# Patient Record
Sex: Female | Born: 1980 | Race: Black or African American | Hispanic: No | Marital: Single | Smoking: Never smoker
Health system: Southern US, Community
[De-identification: ages and names within clinical notes are randomized; demographics above are authoritative.]

## PROBLEM LIST (undated history)

## (undated) DIAGNOSIS — N921 Excessive and frequent menstruation with irregular cycle: Secondary | ICD-10-CM

## (undated) DIAGNOSIS — M1990 Unspecified osteoarthritis, unspecified site: Secondary | ICD-10-CM

## (undated) DIAGNOSIS — D500 Iron deficiency anemia secondary to blood loss (chronic): Secondary | ICD-10-CM

## (undated) DIAGNOSIS — K602 Anal fissure, unspecified: Secondary | ICD-10-CM

## (undated) DIAGNOSIS — F329 Major depressive disorder, single episode, unspecified: Secondary | ICD-10-CM

## (undated) DIAGNOSIS — D259 Leiomyoma of uterus, unspecified: Secondary | ICD-10-CM

## (undated) DIAGNOSIS — D649 Anemia, unspecified: Secondary | ICD-10-CM

## (undated) DIAGNOSIS — M797 Fibromyalgia: Secondary | ICD-10-CM

## (undated) DIAGNOSIS — D219 Benign neoplasm of connective and other soft tissue, unspecified: Secondary | ICD-10-CM

## (undated) DIAGNOSIS — K219 Gastro-esophageal reflux disease without esophagitis: Secondary | ICD-10-CM

## (undated) DIAGNOSIS — K589 Irritable bowel syndrome without diarrhea: Secondary | ICD-10-CM

## (undated) DIAGNOSIS — F32A Depression, unspecified: Secondary | ICD-10-CM

## (undated) DIAGNOSIS — K859 Acute pancreatitis without necrosis or infection, unspecified: Secondary | ICD-10-CM

## (undated) DIAGNOSIS — E669 Obesity, unspecified: Secondary | ICD-10-CM

## (undated) DIAGNOSIS — F419 Anxiety disorder, unspecified: Secondary | ICD-10-CM

## (undated) HISTORY — DX: Anxiety disorder, unspecified: F41.9

## (undated) HISTORY — DX: Anal fissure, unspecified: K60.2

## (undated) HISTORY — DX: Leiomyoma of uterus, unspecified: D25.9

## (undated) HISTORY — DX: Anemia, unspecified: D64.9

## (undated) HISTORY — DX: Irritable bowel syndrome, unspecified: K58.9

## (undated) HISTORY — DX: Excessive and frequent menstruation with irregular cycle: N92.1

## (undated) HISTORY — DX: Benign neoplasm of connective and other soft tissue, unspecified: D21.9

## (undated) HISTORY — DX: Acute pancreatitis without necrosis or infection, unspecified: K85.90

## (undated) HISTORY — DX: Iron deficiency anemia secondary to blood loss (chronic): D50.0

## (undated) HISTORY — DX: Obesity, unspecified: E66.9

## (undated) HISTORY — DX: Gastro-esophageal reflux disease without esophagitis: K21.9

## (undated) HISTORY — DX: Unspecified osteoarthritis, unspecified site: M19.90

## (undated) HISTORY — DX: Fibromyalgia: M79.7

## (undated) HISTORY — DX: Depression, unspecified: F32.A

## (undated) HISTORY — DX: Major depressive disorder, single episode, unspecified: F32.9

## (undated) HISTORY — PX: LIPOSUCTION: SHX10

---

## 2002-05-07 ENCOUNTER — Other Ambulatory Visit: Admission: RE | Admit: 2002-05-07 | Discharge: 2002-05-07 | Payer: Self-pay | Admitting: Obstetrics & Gynecology

## 2002-05-07 ENCOUNTER — Other Ambulatory Visit: Admission: RE | Admit: 2002-05-07 | Discharge: 2002-05-07 | Payer: Self-pay | Admitting: *Deleted

## 2003-05-21 ENCOUNTER — Other Ambulatory Visit: Admission: RE | Admit: 2003-05-21 | Discharge: 2003-05-21 | Payer: Self-pay | Admitting: Internal Medicine

## 2005-02-18 ENCOUNTER — Other Ambulatory Visit: Admission: RE | Admit: 2005-02-18 | Discharge: 2005-02-18 | Payer: Self-pay | Admitting: Obstetrics and Gynecology

## 2005-05-14 ENCOUNTER — Ambulatory Visit (HOSPITAL_COMMUNITY): Admission: RE | Admit: 2005-05-14 | Discharge: 2005-05-14 | Payer: Self-pay

## 2006-04-05 ENCOUNTER — Emergency Department (HOSPITAL_COMMUNITY): Admission: EM | Admit: 2006-04-05 | Discharge: 2006-04-05 | Payer: Self-pay | Admitting: Emergency Medicine

## 2013-07-31 ENCOUNTER — Telehealth: Payer: Self-pay | Admitting: Hematology & Oncology

## 2013-07-31 NOTE — Telephone Encounter (Signed)
Spoke w NEW PATIENT today to remind them of their appointment with Dr. Ennever. Also, advised them to bring all medication bottles and insurance card information. ° °

## 2013-08-01 ENCOUNTER — Ambulatory Visit (HOSPITAL_BASED_OUTPATIENT_CLINIC_OR_DEPARTMENT_OTHER): Payer: Managed Care, Other (non HMO) | Admitting: Hematology & Oncology

## 2013-08-01 ENCOUNTER — Encounter: Payer: Self-pay | Admitting: Hematology & Oncology

## 2013-08-01 ENCOUNTER — Ambulatory Visit (HOSPITAL_BASED_OUTPATIENT_CLINIC_OR_DEPARTMENT_OTHER)
Admission: RE | Admit: 2013-08-01 | Discharge: 2013-08-01 | Disposition: A | Discharge status: Home / Self Care | Payer: Managed Care, Other (non HMO) | Source: Ambulatory Visit | Attending: Hematology & Oncology | Admitting: Hematology & Oncology

## 2013-08-01 ENCOUNTER — Other Ambulatory Visit: Payer: Managed Care, Other (non HMO) | Admitting: Lab

## 2013-08-01 ENCOUNTER — Ambulatory Visit: Payer: Managed Care, Other (non HMO)

## 2013-08-01 VITALS — BP 110/72 | HR 103 | Temp 98.8°F | Resp 14 | Ht 61.0 in | Wt 169.0 lb

## 2013-08-01 DIAGNOSIS — R591 Generalized enlarged lymph nodes: Secondary | ICD-10-CM

## 2013-08-01 DIAGNOSIS — R599 Enlarged lymph nodes, unspecified: Secondary | ICD-10-CM

## 2013-08-01 LAB — CBC WITH DIFFERENTIAL (CANCER CENTER ONLY)
BASO#: 0 10*3/uL (ref 0.0–0.2)
BASO%: 0.2 % (ref 0.0–2.0)
EOS%: 2.1 % (ref 0.0–7.0)
Eosinophils Absolute: 0.1 10*3/uL (ref 0.0–0.5)
HCT: 36.1 % (ref 34.8–46.6)
HEMOGLOBIN: 12.7 g/dL (ref 11.6–15.9)
LYMPH#: 2.6 10*3/uL (ref 0.9–3.3)
LYMPH%: 44.5 % (ref 14.0–48.0)
MCH: 31.4 pg (ref 26.0–34.0)
MCHC: 35.2 g/dL (ref 32.0–36.0)
MCV: 89 fL (ref 81–101)
MONO#: 0.8 10*3/uL (ref 0.1–0.9)
MONO%: 13.4 % — AB (ref 0.0–13.0)
NEUT#: 2.3 10*3/uL (ref 1.5–6.5)
NEUT%: 39.8 % (ref 39.6–80.0)
Platelets: 316 10*3/uL (ref 145–400)
RBC: 4.04 10*6/uL (ref 3.70–5.32)
RDW: 12.1 % (ref 11.1–15.7)
WBC: 5.8 10*3/uL (ref 3.9–10.0)

## 2013-08-01 LAB — CMP (CANCER CENTER ONLY)
ALT(SGPT): 22 U/L (ref 10–47)
AST: 25 U/L (ref 11–38)
Albumin: 3.6 g/dL (ref 3.3–5.5)
Alkaline Phosphatase: 81 U/L (ref 26–84)
BUN, Bld: 10 mg/dL (ref 7–22)
CO2: 25 mEq/L (ref 18–33)
CREATININE: 0.6 mg/dL (ref 0.6–1.2)
Calcium: 9.2 mg/dL (ref 8.0–10.3)
Chloride: 103 mEq/L (ref 98–108)
GLUCOSE: 102 mg/dL (ref 73–118)
POTASSIUM: 3.7 meq/L (ref 3.3–4.7)
Sodium: 138 mEq/L (ref 128–145)
Total Bilirubin: 0.4 mg/dl (ref 0.20–1.60)
Total Protein: 8.7 g/dL — ABNORMAL HIGH (ref 6.4–8.1)

## 2013-08-01 LAB — CHCC SATELLITE - SMEAR

## 2013-08-01 NOTE — Progress Notes (Signed)
Referral MD  Reason for Referral: Lymphadenopathy   Chief Complaint  Patient presents with  . NEW PATIENT  : I have enlarged lymph nodes in my abdomen   HPI: Ms. Liebig is a very nice 33 year old African American female. She actually lives in [place]. She comes up every day as her mother is having health issues. Her mother sounds like will be undergoing dialysis.  Ms. Pasley began to have some abdominal pain recently. This was back in early May.  She ultimately had a CT scan done. This was done at [facility]. The CT scan showed some enlarged lymph nodes in the abdomen. No lymph node was greater then 39mm. She also has some back pain at the time. She does have some uterine fibroids.  She was then seen by her gynecologist, Dr. Helane Rima. Dr. Helane Rima felt that a hematology evaluation was indicated. As such, Ms. Nardelli was, referred to the [facility].  Ms. Inga has had no fever. She's had no sweats. There's been no rashes. She's had no joint issues. There's been no cough or shortness of breath. She's had no change in bowel or bladder habits. She's never had any kind gallbladder issues. She does not smoke or drink.  She has had 3 elective abortions. I think the last one was in January of this year.  She has not noted any swollen glands.  She patient lost about 20 pounds.   No past medical history on file.:  No past surgical history on file.:  Current outpatient prescriptions:amphetamine-dextroamphetamine (ADDERALL) 20 MG tablet, Take 20 mg by mouth 2 (two) times daily., Disp: , Rfl: ;  B Complex Vitamins (VITAMIN B COMPLEX PO), Take by mouth every morning., Disp: , Rfl: ;  fluticasone (FLONASE) 50 MCG/ACT nasal spray, Place 1 spray into both nostrils as needed. , Disp: , Rfl: ;  omeprazole (PRILOSEC) 20 MG capsule, Take 20 mg by mouth as needed. , Disp: , Rfl:  Zinc-Magnesium Aspart-Vit B6 (ZINC MAGNESIUM ASPARTATE PO), Take by mouth every morning., Disp: , Rfl: ;   PRISTIQ 100 MG 24 hr tablet, , Disp: , Rfl: :  :  Allergies  Allergen Reactions  . Penicillins     rash  :  No family history on file.:  History   Social History  . Marital Status: Single    Spouse Name: N/A    Number of Children: N/A  . Years of Education: N/A   Occupational History  . Not on file.   Social History Main Topics  . Smoking status: Never Smoker   . Smokeless tobacco: Never Used     Comment: never used tobacco  . Alcohol Use: Not on file  . Drug Use: Not on file  . Sexual Activity: Not on file   Other Topics Concern  . Not on file   Social History Narrative  . No narrative on file  :  Pertinent items are noted in HPI.  Exam: @IPVITALS @  this is a well-developed and well-nourished [place] American female in no obvious distress. Vital signs show temperature of 98.8. Pulse 103. Blood pressure 110/72. Weight is 169 pounds. Head and neck exam shows no ocular or oral lesions. She has no palpable cervical or supraclavicular nodes. Lungs are clear. Cardiac exam regular rate and rhythm with no murmurs rubs or bruits. Axillary exam shows no bilateral axillary adenopathy. Abdominal exam is soft. Has good bowel sounds. There is no fluid wave. There is no guarding or rebound tenderness. She has no palpable liver  or spleen tip. Back exam no tenderness over the spine ribs or hips. Extremities shows no clubbing cyanosis or edema. Neurological exam shows no focal neurological deficits. Skin exam shows no rashes ecchymoses or petechia.    Recent Labs  08/01/13 1159  WBC 5.8  HGB 12.7  HCT 36.1  PLT 316    Recent Labs  08/01/13 1159  NA 138  K 3.7  CL 103  CO2 25  GLUCOSE 102  BUN 10  CREATININE 0.6  CALCIUM 9.2    Blood smear review: No data  Pathology: No data     Assessment and Plan: Ms. Corwin Levins is a very charming 33 year old African American female. 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 She is truly  a good person for doing this kind of work.  I am certainly not too impressed with the CAT scan results. There really are no significantly enlarged lymph nodes. Is really hard to make any conclusions about lymph nodes that measure a little over 1 cm.  Of note, there is a history of sarcoid in the family. As such, this certainly could be a possibility.  Going to get a chest x-ray on her. We will get this today.  I think that the next step would be PET scan. If the PET scan does show significant activity in these lymph nodes, then I think we probably will have to think about a biopsy. I think the only way to biopsy these nodes would be a surgical procedure with a laparoscopy.  I spent a good hour or more with her. I went over lab work. I explained my recommendations. She is in agreement.  We will be in touch with a get the results back for x-ray studies. If we need to pursue further interventions, then we will let her no.  I suppose she may have lymphoma but a bone would think that this would be an indolent lymphoma that would not be any type of therapy.

## 2013-08-02 ENCOUNTER — Telehealth: Payer: Self-pay | Admitting: *Deleted

## 2013-08-02 LAB — ANGIOTENSIN CONVERTING ENZYME: ANGIOTENSIN 1 CE: 45 U/L (ref 8–52)

## 2013-08-02 LAB — ANA: ANA: NEGATIVE

## 2013-08-02 NOTE — Telephone Encounter (Addendum)
Message copied by Lenn Sink on Thu Aug 02, 2013 12:55 PM ------      Message from: Burney Gauze R      Created: Thu Aug 02, 2013  6:55 AM       Call - cxr is normal.  No obvious sarcoidosis.  pete ------Informed pt that cxr is normal.  No obvious sarcoidosis

## 2013-08-03 LAB — PROTEIN ELECTROPHORESIS, SERUM, WITH REFLEX
ALPHA-1-GLOBULIN: 3.7 % (ref 2.9–4.9)
Albumin ELP: 51.5 % — ABNORMAL LOW (ref 55.8–66.1)
Alpha-2-Globulin: 9.9 % (ref 7.1–11.8)
BETA GLOBULIN: 6.1 % (ref 4.7–7.2)
Beta 2: 6.8 % — ABNORMAL HIGH (ref 3.2–6.5)
Gamma Globulin: 22 % — ABNORMAL HIGH (ref 11.1–18.8)
TOTAL PROTEIN, SERUM ELECTROPHOR: 7.7 g/dL (ref 6.0–8.3)

## 2013-08-03 LAB — BETA 2 MICROGLOBULIN, SERUM: BETA 2 MICROGLOBULIN: 2.01 mg/L (ref <=2.51)

## 2013-08-03 LAB — HIV-1 RNA QUANT-NO REFLEX-BLD: HIV-1 RNA Quant, Log: <1.3 {Log} (ref <1.30)

## 2013-08-03 LAB — LACTATE DEHYDROGENASE: LDH: 183 U/L (ref 94–250)

## 2013-08-08 ENCOUNTER — Other Ambulatory Visit: Payer: Self-pay | Admitting: Hematology & Oncology

## 2013-08-08 ENCOUNTER — Telehealth: Payer: Self-pay | Admitting: Hematology & Oncology

## 2013-08-08 DIAGNOSIS — R590 Localized enlarged lymph nodes: Secondary | ICD-10-CM

## 2013-08-08 NOTE — Telephone Encounter (Signed)
Pt aware 6-19 CTto be NPO and to drink contrast. She is aware to go by [facility] [address] to get instructions. Baxter Flattery aware

## 2013-08-10 ENCOUNTER — Ambulatory Visit (HOSPITAL_COMMUNITY): Payer: Managed Care, Other (non HMO)

## 2013-08-10 ENCOUNTER — Other Ambulatory Visit (HOSPITAL_BASED_OUTPATIENT_CLINIC_OR_DEPARTMENT_OTHER): Payer: Managed Care, Other (non HMO)

## 2013-08-10 ENCOUNTER — Ambulatory Visit
Admission: RE | Admit: 2013-08-10 | Discharge: 2013-08-10 | Disposition: A | Discharge status: Home / Self Care | Payer: Managed Care, Other (non HMO) | Source: Ambulatory Visit | Attending: Hematology & Oncology | Admitting: Hematology & Oncology

## 2013-08-10 DIAGNOSIS — R590 Localized enlarged lymph nodes: Secondary | ICD-10-CM

## 2013-08-10 MED ORDER — IOHEXOL 300 MG/ML  SOLN
100.0000 mL | Freq: Once | INTRAMUSCULAR | Status: AC | PRN
  Administered 2013-08-10: 100 mL via INTRAVENOUS

## 2013-11-27 ENCOUNTER — Telehealth: Payer: Self-pay | Admitting: Hematology & Oncology

## 2013-11-27 NOTE — Telephone Encounter (Signed)
Aubrey for [employer] requested office notes from 08/01/2013 to current.  Referred by Dr. Dian Queen   F: [phone] P: [phone]

## 2014-06-27 ENCOUNTER — Other Ambulatory Visit: Payer: Self-pay | Admitting: Nurse Practitioner

## 2014-06-27 DIAGNOSIS — R599 Enlarged lymph nodes, unspecified: Secondary | ICD-10-CM | POA: Insufficient documentation

## 2014-06-27 DIAGNOSIS — R591 Generalized enlarged lymph nodes: Secondary | ICD-10-CM

## 2014-06-28 ENCOUNTER — Ambulatory Visit (HOSPITAL_BASED_OUTPATIENT_CLINIC_OR_DEPARTMENT_OTHER): Payer: 59 | Admitting: Hematology & Oncology

## 2014-06-28 ENCOUNTER — Other Ambulatory Visit (HOSPITAL_BASED_OUTPATIENT_CLINIC_OR_DEPARTMENT_OTHER): Payer: 59

## 2014-06-28 ENCOUNTER — Encounter: Payer: Self-pay | Admitting: Hematology & Oncology

## 2014-06-28 VITALS — BP 116/67 | HR 80 | Temp 98.0°F | Resp 14 | Ht 61.0 in | Wt 173.0 lb

## 2014-06-28 DIAGNOSIS — R591 Generalized enlarged lymph nodes: Secondary | ICD-10-CM

## 2014-06-28 DIAGNOSIS — R52 Pain, unspecified: Secondary | ICD-10-CM

## 2014-06-28 DIAGNOSIS — R5383 Other fatigue: Secondary | ICD-10-CM

## 2014-06-28 LAB — CBC WITH DIFFERENTIAL (CANCER CENTER ONLY)
BASO#: 0 10*3/uL (ref 0.0–0.2)
BASO%: 0.4 % (ref 0.0–2.0)
EOS%: 2 % (ref 0.0–7.0)
Eosinophils Absolute: 0.1 10*3/uL (ref 0.0–0.5)
HCT: 35.2 % (ref 34.8–46.6)
HGB: 12.1 g/dL (ref 11.6–15.9)
LYMPH#: 2.4 10*3/uL (ref 0.9–3.3)
LYMPH%: 47.8 % (ref 14.0–48.0)
MCH: 30.3 pg (ref 26.0–34.0)
MCHC: 34.4 g/dL (ref 32.0–36.0)
MCV: 88 fL (ref 81–101)
MONO#: 0.7 10*3/uL (ref 0.1–0.9)
MONO%: 14.4 % — ABNORMAL HIGH (ref 0.0–13.0)
NEUT#: 1.8 10*3/uL (ref 1.5–6.5)
NEUT%: 35.4 % — AB (ref 39.6–80.0)
Platelets: 351 10*3/uL (ref 145–400)
RBC: 4 10*6/uL (ref 3.70–5.32)
RDW: 13.3 % (ref 11.1–15.7)
WBC: 4.9 10*3/uL (ref 3.9–10.0)

## 2014-06-28 LAB — COMPREHENSIVE METABOLIC PANEL
ALBUMIN: 4.2 g/dL (ref 3.5–5.2)
ALK PHOS: 59 U/L (ref 39–117)
ALT: 22 U/L (ref 0–35)
AST: 29 U/L (ref 0–37)
BUN: 9 mg/dL (ref 6–23)
CO2: 23 meq/L (ref 19–32)
Calcium: 9.5 mg/dL (ref 8.4–10.5)
Chloride: 102 mEq/L (ref 96–112)
Creatinine, Ser: 0.74 mg/dL (ref 0.50–1.10)
GLUCOSE: 84 mg/dL (ref 70–99)
POTASSIUM: 4 meq/L (ref 3.5–5.3)
SODIUM: 138 meq/L (ref 135–145)
TOTAL PROTEIN: 7.7 g/dL (ref 6.0–8.3)
Total Bilirubin: 0.3 mg/dL (ref 0.2–1.2)

## 2014-06-28 NOTE — Progress Notes (Signed)
Hematology and Oncology Follow Up Visit  Stephanie Werner [id number] 1981/01/01 34 y.o. 06/28/2014   Principle Diagnosis:   Chronic abdominal lymphadenopathy  Current Therapy:    Observation     Interim History:  Stephanie Werner is back for follow-up. We saw her last back in June. So far, her workup has been unremarkable. She had a CT scan done back in June which showed mild abdominal lymphadenopathy. I'll the any these lymph nodes should because of her any problems.  We checked her for lupus. I think her ANA was normal. We also checked her for sarcoid and her ACE level was normal at 45. Pressure still has all of her complaints. She does not see a family doctor. I'm unsure as to why she has not.  She does complains of achiness. She complains of joint problems. She complains of being tired. I does have a heart time believing that any of this is related to these lymph nodes.  She's had no fever. She's had no bleeding. Her monthly cycles are okay. Per she's had no rashes. She's had no sweats. There's been no weight loss or weight gain.  Medications:  Current outpatient prescriptions:  .  amphetamine-dextroamphetamine (ADDERALL) 20 MG tablet, Take 20 mg by mouth 2 (two) times daily., Disp: , Rfl:  .  B Complex Vitamins (VITAMIN B COMPLEX PO), Take by mouth every morning., Disp: , Rfl:  .  DULoxetine (CYMBALTA) 60 MG capsule, Take 60 mg by mouth daily., Disp: , Rfl:  .  fluticasone (FLONASE) 50 MCG/ACT nasal spray, Place 1 spray into both nostrils as needed. , Disp: , Rfl:  .  omeprazole (PRILOSEC) 20 MG capsule, Take 20 mg by mouth as needed. , Disp: , Rfl:  .  Zinc-Magnesium Aspart-Vit B6 (ZINC MAGNESIUM ASPARTATE PO), Take by mouth every morning., Disp: , Rfl:   Allergies:  Allergies  Allergen Reactions  . Penicillins     rash    Past Medical History, Surgical history, Social history, and Family History were reviewed and updated.  Review of Systems: As above  Physical Exam:  height is 5\' 1"  (1.549 m) and weight is 173 lb (78.472 kg). Her oral temperature is 98 F (36.7 C). Her blood pressure is 116/67 and her pulse is 80. Her respiration is 14.   Wt Readings from Last 3 Encounters:  06/28/14 173 lb (78.472 kg)  08/01/13 169 lb (76.658 kg)     Well-developed and well-nourished African-American female. Head and neck exam shows no ocular or oral lesions. She has no adenopathy in the neck. Lungs are clear. Cardiac exam regular rate and rhythm with no murmurs, rubs or bruits. Abdomen is soft. She has good bowel sounds. There is no fluid wave. There is no palpable liver or spleen tip. Axillary exam shows no bilateral axillary adenopathy. Back exam shows no tenderness over the spine, ribs or hips. Extremities shows no clubbing, cyanosis or edema. Skin exam shows no rashes, ecchymoses or petechia.  Lab Results  Component Value Date   WBC 4.9 06/28/2014   HGB 12.1 06/28/2014   HCT 35.2 06/28/2014   MCV 88 06/28/2014   PLT 351 06/28/2014     Chemistry      Component Value Date/Time   NA 138 08/01/2013 1159   K 3.7 08/01/2013 1159   CL 103 08/01/2013 1159   CO2 25 08/01/2013 1159   BUN 10 08/01/2013 1159   CREATININE 0.6 08/01/2013 1159      Component Value Date/Time   CALCIUM  9.2 08/01/2013 1159   ALKPHOS 81 08/01/2013 1159   AST 25 08/01/2013 1159   ALT 22 08/01/2013 1159   BILITOT 0.40 08/01/2013 1159         Impression and Plan: Stephanie Werner is 53 year old African-American female. She has chronic abdominal lymphadenopathy.  I think the next test would have to be a PET scan. Again I just don't believe that she has any malignancy. These lymph nodes might be reactive. She may have sarcoid as that does run in the family.  Ultimately, she may need to have a biopsy done. This may be the only way to find out what is going on. I would think given the size of these lymph nodes, that any biopsy would have to be a laparoscopic or open biopsy.  She probably  needs to see a rheumatologist. Again I suspect that she may have some rheumatologic condition.  We will see what the PET scan shows. We'll try take care of a lot of this over the phone.  I spent about 30 minutes with her. It's been almost a year since I last saw her.   Stephanie Napoleon, MD 5/6/20165:00 PM

## 2014-07-09 ENCOUNTER — Ambulatory Visit (HOSPITAL_COMMUNITY)
Admission: RE | Admit: 2014-07-09 | Discharge: 2014-07-09 | Disposition: A | Discharge status: Home / Self Care | Payer: 59 | Source: Ambulatory Visit | Attending: Hematology & Oncology | Admitting: Hematology & Oncology

## 2014-07-09 DIAGNOSIS — R591 Generalized enlarged lymph nodes: Secondary | ICD-10-CM

## 2014-07-09 DIAGNOSIS — R599 Enlarged lymph nodes, unspecified: Secondary | ICD-10-CM | POA: Diagnosis present

## 2014-07-09 LAB — GLUCOSE, CAPILLARY: GLUCOSE-CAPILLARY: 90 mg/dL (ref 65–99)

## 2014-07-09 MED ORDER — FLUDEOXYGLUCOSE F - 18 (FDG) INJECTION
8.3800 | Freq: Once | INTRAVENOUS | Status: AC | PRN
  Administered 2014-07-09: 8.38 via INTRAVENOUS

## 2014-07-11 ENCOUNTER — Telehealth: Payer: Self-pay | Admitting: *Deleted

## 2014-07-11 NOTE — Telephone Encounter (Addendum)
Patient aware of results. Read her entire passage and she expressed understanding.   ----- Message from Volanda Napoleon, MD sent at 07/10/2014  5:38 PM EDT ----- Please call and tell her that the PET scan really does not show any abnormal uptake in the lymph nodes. The lymph nodes have not changed in size in over a year. I just cannot believe that she has any malignant or cancerous process in her abdomen. There really is not much else that we need to do for her. The only other option is surgery to remove a lymph node and this would certainly be a very large undertaking since there's been no change in the lymph node size in over a year. I think her primary care doctor needs to see her again and see what else they can do. I am glad that there is no obvious cancer or lymphoma that is identified. We are still praying for her. Thanks

## 2014-08-30 ENCOUNTER — Ambulatory Visit: Payer: 59 | Admitting: Family

## 2014-08-30 ENCOUNTER — Other Ambulatory Visit: Payer: 59

## 2014-09-09 ENCOUNTER — Telehealth: Payer: Self-pay | Admitting: Hematology & Oncology

## 2014-09-09 NOTE — Telephone Encounter (Signed)
PT WAS NOT AWARE OF THE APPT ON 7/8 AND HAS BEEN WORKING WITH PCP SINCE LAST SEEN.  WILL CALL IF NEED

## 2014-10-30 ENCOUNTER — Telehealth: Payer: Self-pay | Admitting: *Deleted

## 2014-10-30 NOTE — Telephone Encounter (Signed)
Patient is seen at the office for lymphadenopathy. She has recently had labs drawn at her [place] office which shows her ferritin to be low at 4. She would like for one of the providers to see her for a possible iron infusion. Spoke to Dr Marin Olp who would like patient to come in an be assessed with Laverna Peace NP with possible infusion. Appointment made and patient aware.

## 2014-11-05 ENCOUNTER — Other Ambulatory Visit (HOSPITAL_BASED_OUTPATIENT_CLINIC_OR_DEPARTMENT_OTHER): Payer: 59

## 2014-11-05 ENCOUNTER — Ambulatory Visit (HOSPITAL_BASED_OUTPATIENT_CLINIC_OR_DEPARTMENT_OTHER): Payer: 59 | Admitting: Family

## 2014-11-05 ENCOUNTER — Encounter: Payer: Self-pay | Admitting: Family

## 2014-11-05 ENCOUNTER — Encounter: Payer: Self-pay | Admitting: Emergency Medicine

## 2014-11-05 ENCOUNTER — Ambulatory Visit (HOSPITAL_BASED_OUTPATIENT_CLINIC_OR_DEPARTMENT_OTHER): Payer: 59

## 2014-11-05 VITALS — BP 123/71 | HR 86 | Temp 98.4°F | Resp 16 | Ht 61.0 in | Wt 169.0 lb

## 2014-11-05 DIAGNOSIS — D509 Iron deficiency anemia, unspecified: Secondary | ICD-10-CM

## 2014-11-05 DIAGNOSIS — R591 Generalized enlarged lymph nodes: Secondary | ICD-10-CM

## 2014-11-05 LAB — CBC WITH DIFFERENTIAL (CANCER CENTER ONLY)
BASO#: 0 10*3/uL (ref 0.0–0.2)
BASO%: 0.2 % (ref 0.0–2.0)
EOS ABS: 0.1 10*3/uL (ref 0.0–0.5)
EOS%: 1.6 % (ref 0.0–7.0)
HCT: 39.2 % (ref 34.8–46.6)
HGB: 13.3 g/dL (ref 11.6–15.9)
LYMPH#: 2.2 10*3/uL (ref 0.9–3.3)
LYMPH%: 39 % (ref 14.0–48.0)
MCH: 30.9 pg (ref 26.0–34.0)
MCHC: 33.9 g/dL (ref 32.0–36.0)
MCV: 91 fL (ref 81–101)
MONO#: 0.5 10*3/uL (ref 0.1–0.9)
MONO%: 8.4 % (ref 0.0–13.0)
NEUT#: 2.9 10*3/uL (ref 1.5–6.5)
NEUT%: 50.8 % (ref 39.6–80.0)
PLATELETS: 314 10*3/uL (ref 145–400)
RBC: 4.3 10*6/uL (ref 3.70–5.32)
RDW: 15.8 % — AB (ref 11.1–15.7)
WBC: 5.6 10*3/uL (ref 3.9–10.0)

## 2014-11-05 LAB — CHCC SATELLITE - SMEAR

## 2014-11-05 LAB — IRON AND TIBC CHCC
%SAT: 18 % — AB (ref 21–57)
IRON: 65 ug/dL (ref 41–142)
TIBC: 355 ug/dL (ref 236–444)
UIBC: 290 ug/dL (ref 120–384)

## 2014-11-05 LAB — FERRITIN CHCC: FERRITIN: 15 ng/mL (ref 9–269)

## 2014-11-05 MED ORDER — SODIUM CHLORIDE 0.9 % IV SOLN
510.0000 mg | Freq: Once | INTRAVENOUS | Status: AC
  Administered 2014-11-05: 510 mg via INTRAVENOUS
  Filled 2014-11-05: qty 17

## 2014-11-05 NOTE — Patient Instructions (Signed)

## 2014-11-05 NOTE — Progress Notes (Signed)
Hematology and Oncology Follow Up Visit  Stephanie Werner [id number] 04/22/1980 34 y.o. 11/05/2014   Principle Diagnosis:  Chronic abdominal lymphadenopathy Iron deficiency    Current Therapy:   IV iron as indicated     Interim History:  Stephanie Werner is here today for a follow-up. She is having a lot of fatigue and is chewing ice. She also states that her gynecologist checked her iron saturation and that it was 4%. She is taking an oral iron supplement but it upsets her stomach and causes constipation. She is ok to stop this and we will replace her iron IV.  Her PET scan in May showed no appreciable change in size of small lymph nodes in the upper abdomen.The small size and low metabolic lymphoproliferative process were likely reactive in nature. Her ANA was negative in June.  Her cycles have been heavy. She recently started on an oral contraceptive but is still having frequent cramps.  She has had some SOB with exertion.  She denies fever, chills, n/v, cough, rash, dizziness, chest pain, palpitations, abdominal pain, diarrhea, changes in bladder habits. She had no lymphadenopathy on exam.   No swelling, tenderness, numbness or tingling in her hands or feet. No c/o pain at this time.  She is eating well and staying hydrated. She has had no significant weight loss or gain.   Medications:    Medication List       This list is accurate as of: 11/05/14 10:54 AM.  Always use your most recent med list.               amphetamine-dextroamphetamine 20 MG tablet  Commonly known as:  ADDERALL  Take 20 mg by mouth 2 (two) times daily.     buPROPion 150 MG 24 hr tablet  Commonly known as:  WELLBUTRIN XL     DULoxetine 60 MG capsule  Commonly known as:  CYMBALTA  Take 60 mg by mouth daily.     fluticasone 50 MCG/ACT nasal spray  Commonly known as:  FLONASE  Place 1 spray into both nostrils as needed.     omeprazole 20 MG capsule  Commonly known as:  PRILOSEC  Take 20 mg by mouth  as needed.     VITAMIN B COMPLEX PO  Take by mouth every morning.     ZINC MAGNESIUM ASPARTATE PO  Take by mouth every morning.        Allergies:  Allergies  Allergen Reactions  . Penicillins     rash    Past Medical History, Surgical history, Social history, and Family History were reviewed and updated.  Review of Systems: All other 10 point review of systems is negative.   Physical Exam:  height is 5\' 1"  (1.549 m) and weight is 169 lb (76.658 kg). Her oral temperature is 98.4 F (36.9 C). Her blood pressure is 123/71 and her pulse is 86. Her respiration is 16.   Wt Readings from Last 3 Encounters:  11/05/14 169 lb (76.658 kg)  06/28/14 173 lb (78.472 kg)  08/01/13 169 lb (76.658 kg)    Ocular: Sclerae unicteric, pupils equal, round and reactive to light Ear-nose-throat: Oropharynx clear, dentition fair Lymphatic: No cervical or supraclavicular adenopathy Lungs no rales or rhonchi, good excursion bilaterally Heart regular rate and rhythm, no murmur appreciated Abd soft, nontender, positive bowel sounds MSK no focal spinal tenderness, no joint edema Neuro: non-focal, well-oriented, appropriate affect Breasts: Deferred  Lab Results  Component Value Date   WBC 5.6 11/05/2014  HGB 13.3 11/05/2014   HCT 39.2 11/05/2014   MCV 91 11/05/2014   PLT 314 11/05/2014   No results found for: FERRITIN, IRON, TIBC, UIBC, IRONPCTSAT Lab Results  Component Value Date   RBC 4.30 11/05/2014   No results found for: KPAFRELGTCHN, LAMBDASER, KAPLAMBRATIO No results found for: Kandis Cocking, [facility] Lab Results  Component Value Date   TOTALPROTELP 7.7 08/01/2013   ALBUMINELP 51.5* 08/01/2013   A1GS 3.7 08/01/2013   A2GS 9.9 08/01/2013   BETS 6.1 08/01/2013   BETA2SER 6.8* 08/01/2013   GAMS 22.0* 08/01/2013   MSPIKE NOT DET 08/01/2013   SPEI * 08/01/2013     Chemistry      Component Value Date/Time   NA 138 06/28/2014 1037   NA 138 08/01/2013 1159   K 4.0  06/28/2014 1037   K 3.7 08/01/2013 1159   CL 102 06/28/2014 1037   CL 103 08/01/2013 1159   CO2 23 06/28/2014 1037   CO2 25 08/01/2013 1159   BUN 9 06/28/2014 1037   BUN 10 08/01/2013 1159   CREATININE 0.74 06/28/2014 1037   CREATININE 0.6 08/01/2013 1159      Component Value Date/Time   CALCIUM 9.5 06/28/2014 1037   CALCIUM 9.2 08/01/2013 1159   ALKPHOS 59 06/28/2014 1037   ALKPHOS 81 08/01/2013 1159   AST 29 06/28/2014 1037   AST 25 08/01/2013 1159   ALT 22 06/28/2014 1037   ALT 22 08/01/2013 1159   BILITOT 0.3 06/28/2014 1037   BILITOT 0.40 08/01/2013 1159     Impression and Plan: Stephanie Werner is a 34 yo African-American female with chronic abdominal lymphadenopathy and iron deficiency anemia. Her PET scan in May showed no changes and with the small size and low metabolic activity of the nodes was felt to be reactive in nature. At this time she is iron deficient. She is symptomatic with fatigue, chewing ice and SOB with exertion.  We will give her a dose of Feraheme today and possibly a second dose in 8 days. We will plan to see her back in 6 weeks for labs and follow-up.  She knows to call here with any questions or concerns. We can certainly see her sooner if need be.   Eliezer Bottom, NP 9/13/201610:54 AM

## 2014-11-08 LAB — COMPREHENSIVE METABOLIC PANEL
ALBUMIN: 4.3 g/dL (ref 3.6–5.1)
ALK PHOS: 61 U/L (ref 33–115)
ALT: 32 U/L — AB (ref 6–29)
AST: 81 U/L — AB (ref 10–30)
BILIRUBIN TOTAL: 0.3 mg/dL (ref 0.2–1.2)
BUN: 9 mg/dL (ref 7–25)
CALCIUM: 9.5 mg/dL (ref 8.6–10.2)
CO2: 19 mmol/L — AB (ref 20–31)
CREATININE: 0.79 mg/dL (ref 0.50–1.10)
Chloride: 106 mmol/L (ref 98–110)
Glucose, Bld: 125 mg/dL — ABNORMAL HIGH (ref 65–99)
Potassium: 4.2 mmol/L (ref 3.5–5.3)
SODIUM: 138 mmol/L (ref 135–146)
TOTAL PROTEIN: 7.7 g/dL (ref 6.1–8.1)

## 2014-11-08 LAB — LACTATE DEHYDROGENASE: LDH: 254 U/L — ABNORMAL HIGH (ref 94–250)

## 2014-11-08 LAB — HEMOGLOBINOPATHY EVALUATION
HEMOGLOBIN OTHER: 0 %
HGB A: 97.3 % (ref 96.8–97.8)
Hgb A2 Quant: 2.7 % (ref 2.2–3.2)
Hgb F Quant: 0 % (ref 0.0–2.0)
Hgb S Quant: 0 %

## 2014-11-08 LAB — SEDIMENTATION RATE: Sed Rate: 4 mm/hr (ref 0–20)

## 2014-11-13 ENCOUNTER — Other Ambulatory Visit: Payer: Self-pay | Admitting: Family

## 2014-11-13 DIAGNOSIS — R591 Generalized enlarged lymph nodes: Secondary | ICD-10-CM

## 2014-12-05 ENCOUNTER — Ambulatory Visit
Admission: RE | Admit: 2014-12-05 | Discharge: 2014-12-05 | Disposition: A | Discharge status: Home / Self Care | Payer: 59 | Source: Ambulatory Visit | Attending: Otolaryngology | Admitting: Otolaryngology

## 2014-12-05 ENCOUNTER — Other Ambulatory Visit: Payer: Self-pay | Admitting: Otolaryngology

## 2014-12-05 DIAGNOSIS — J019 Acute sinusitis, unspecified: Secondary | ICD-10-CM

## 2014-12-10 ENCOUNTER — Other Ambulatory Visit: Payer: Self-pay | Admitting: Family

## 2014-12-10 ENCOUNTER — Ambulatory Visit: Payer: 59

## 2014-12-10 ENCOUNTER — Other Ambulatory Visit: Payer: 59

## 2014-12-10 ENCOUNTER — Ambulatory Visit: Payer: 59 | Admitting: Family

## 2014-12-20 ENCOUNTER — Other Ambulatory Visit (HOSPITAL_BASED_OUTPATIENT_CLINIC_OR_DEPARTMENT_OTHER): Payer: 59

## 2014-12-20 ENCOUNTER — Encounter: Payer: Self-pay | Admitting: Family

## 2014-12-20 ENCOUNTER — Ambulatory Visit (HOSPITAL_BASED_OUTPATIENT_CLINIC_OR_DEPARTMENT_OTHER): Payer: 59 | Admitting: Family

## 2014-12-20 ENCOUNTER — Ambulatory Visit (HOSPITAL_BASED_OUTPATIENT_CLINIC_OR_DEPARTMENT_OTHER): Payer: 59

## 2014-12-20 VITALS — BP 90/69 | HR 79 | Temp 99.0°F | Resp 16 | Ht 61.0 in | Wt 177.0 lb

## 2014-12-20 DIAGNOSIS — R591 Generalized enlarged lymph nodes: Secondary | ICD-10-CM

## 2014-12-20 DIAGNOSIS — R590 Localized enlarged lymph nodes: Secondary | ICD-10-CM | POA: Diagnosis not present

## 2014-12-20 DIAGNOSIS — D509 Iron deficiency anemia, unspecified: Secondary | ICD-10-CM

## 2014-12-20 LAB — CBC WITH DIFFERENTIAL (CANCER CENTER ONLY)
BASO#: 0 10*3/uL (ref 0.0–0.2)
BASO%: 0.4 % (ref 0.0–2.0)
EOS ABS: 0.1 10*3/uL (ref 0.0–0.5)
EOS%: 3 % (ref 0.0–7.0)
HEMATOCRIT: 36 % (ref 34.8–46.6)
HGB: 12.5 g/dL (ref 11.6–15.9)
LYMPH#: 1.9 10*3/uL (ref 0.9–3.3)
LYMPH%: 40.5 % (ref 14.0–48.0)
MCH: 31.8 pg (ref 26.0–34.0)
MCHC: 34.7 g/dL (ref 32.0–36.0)
MCV: 92 fL (ref 81–101)
MONO#: 0.6 10*3/uL (ref 0.1–0.9)
MONO%: 12.8 % (ref 0.0–13.0)
NEUT#: 2 10*3/uL (ref 1.5–6.5)
NEUT%: 43.3 % (ref 39.6–80.0)
Platelets: 293 10*3/uL (ref 145–400)
RBC: 3.93 10*6/uL (ref 3.70–5.32)
RDW: 13.4 % (ref 11.1–15.7)
WBC: 4.6 10*3/uL (ref 3.9–10.0)

## 2014-12-20 LAB — CMP (CANCER CENTER ONLY)
ALBUMIN: 3.8 g/dL (ref 3.3–5.5)
ALT(SGPT): 32 U/L (ref 10–47)
AST: 28 U/L (ref 11–38)
Alkaline Phosphatase: 82 U/L (ref 26–84)
BILIRUBIN TOTAL: 0.4 mg/dL (ref 0.20–1.60)
BUN, Bld: 10 mg/dL (ref 7–22)
CALCIUM: 9.5 mg/dL (ref 8.0–10.3)
CO2: 24 meq/L (ref 18–33)
Chloride: 105 mEq/L (ref 98–108)
Creat: 0.7 mg/dl (ref 0.6–1.2)
Glucose, Bld: 99 mg/dL (ref 73–118)
POTASSIUM: 3.6 meq/L (ref 3.3–4.7)
Sodium: 137 mEq/L (ref 128–145)
Total Protein: 7.5 g/dL (ref 6.4–8.1)

## 2014-12-20 LAB — LACTATE DEHYDROGENASE: LDH: 171 U/L (ref 94–250)

## 2014-12-20 MED ORDER — SODIUM CHLORIDE 0.9 % IV SOLN
510.0000 mg | Freq: Once | INTRAVENOUS | Status: AC
  Administered 2014-12-20: 510 mg via INTRAVENOUS
  Filled 2014-12-20: qty 17

## 2014-12-20 NOTE — Progress Notes (Signed)
Hematology and Oncology Follow Up Visit  KORY PANJWANI [id number] 05-04-1980 34 y.o. 12/20/2014   Principle Diagnosis:  Chronic abdominal lymphadenopathy Iron deficiency    Current Therapy:   IV iron as indicated     Interim History:  Ms. Boak is here today for a follow-up. She is recuperating from a sinus infection and does complain of fatigue. She is currently on her cycle which she still describes as heavy. She tried taking birth control but stopped after several a month or so because she felt it was not working.   She still has some SOB with exertion but states this is unchanged.  She also stopped take her Cymbalta "cold [place]" 3 weeks ago. We discussed the dangers of this. She denies feeling depressed or feelings of self harm or harming others.  She is concerned about weight gain (one reason she decided to stop the cymbalta). Her weight is up 8 lbs since her last visit in September.  Her appetite is "ok" she is staying hydrated.  She denies fever, chills, n/v, cough, rash, dizziness, chest pain, palpitations, abdominal pain, diarrhea, changes in bladder habits.  No lymphadenopathy found on exam.  No swelling or tenderness in her extremities. She has some numbness and tingling in her hands and feet at times. This comes and goes.   Medications:    Medication List       This list is accurate as of: 12/20/14  2:32 PM.  Always use your most recent med list.               amphetamine-dextroamphetamine 20 MG tablet  Commonly known as:  ADDERALL  Take 20 mg by mouth 2 (two) times daily.     buPROPion 150 MG 24 hr tablet  Commonly known as:  WELLBUTRIN XL     DULoxetine 60 MG capsule  Commonly known as:  CYMBALTA  Take 60 mg by mouth daily.     fluticasone 50 MCG/ACT nasal spray  Commonly known as:  FLONASE  Place 1 spray into both nostrils as needed.     omeprazole 20 MG capsule  Commonly known as:  PRILOSEC  Take 20 mg by mouth as needed.     VITAMIN B  COMPLEX PO  Take by mouth every morning.     ZINC MAGNESIUM ASPARTATE PO  Take by mouth every morning.        Allergies:  Allergies  Allergen Reactions  . Penicillins     rash    Past Medical History, Surgical history, Social history, and Family History were reviewed and updated.  Review of Systems: All other 10 point review of systems is negative.   Physical Exam:  vitals were not taken for this visit.  Wt Readings from Last 3 Encounters:  11/05/14 169 lb (76.658 kg)  06/28/14 173 lb (78.472 kg)  08/01/13 169 lb (76.658 kg)    Ocular: Sclerae unicteric, pupils equal, round and reactive to light Ear-nose-throat: Oropharynx clear, dentition fair Lymphatic: No cervical or supraclavicular adenopathy Lungs no rales or rhonchi, good excursion bilaterally Heart regular rate and rhythm, no murmur appreciated Abd soft, nontender, positive bowel sounds, no organomegally MSK no focal spinal tenderness, no joint edema Neuro: non-focal, well-oriented, appropriate affect Breasts: Deferred  Lab Results  Component Value Date   WBC 5.6 11/05/2014   HGB 13.3 11/05/2014   HCT 39.2 11/05/2014   MCV 91 11/05/2014   PLT 314 11/05/2014   Lab Results  Component Value Date   FERRITIN 15 11/05/2014  IRON 65 11/05/2014   TIBC 355 11/05/2014   UIBC 290 11/05/2014   IRONPCTSAT 18* 11/05/2014   Lab Results  Component Value Date   RBC 4.30 11/05/2014   No results found for: KPAFRELGTCHN, LAMBDASER, KAPLAMBRATIO No results found for: Osborne [facility] Lab Results  Component Value Date   TOTALPROTELP 7.7 08/01/2013   ALBUMINELP 51.5* 08/01/2013   A1GS 3.7 08/01/2013   A2GS 9.9 08/01/2013   BETS 6.1 08/01/2013   BETA2SER 6.8* 08/01/2013   GAMS 22.0* 08/01/2013   MSPIKE NOT DET 08/01/2013   SPEI * 08/01/2013     Chemistry      Component Value Date/Time   NA 138 11/05/2014 0935   NA 138 08/01/2013 1159   K 4.2 11/05/2014 0935   K 3.7 08/01/2013 1159   CL 106  11/05/2014 0935   CL 103 08/01/2013 1159   CO2 19* 11/05/2014 0935   CO2 25 08/01/2013 1159   BUN 9 11/05/2014 0935   BUN 10 08/01/2013 1159   CREATININE 0.79 11/05/2014 0935   CREATININE 0.6 08/01/2013 1159      Component Value Date/Time   CALCIUM 9.5 11/05/2014 0935   CALCIUM 9.2 08/01/2013 1159   ALKPHOS 61 11/05/2014 0935   ALKPHOS 81 08/01/2013 1159   AST 81* 11/05/2014 0935   AST 25 08/01/2013 1159   ALT 32* 11/05/2014 0935   ALT 22 08/01/2013 1159   BILITOT 0.3 11/05/2014 0935   BILITOT 0.40 08/01/2013 1159     Impression and Plan: Ms. Zuccaro is a 34 yo African-American female with chronic abdominal lymphadenopathy and iron deficiency anemia.  She is symptomatic with fatigue at this time. Her cycles continue to be heavy and she states she does have uterine fibroids. She has a follow-up with her gynecologist next month.   She missed her appointment for the second dose of iron in September so we will give her dose today.  We will see what her iron studies show and bring her back in for another dose in 8 days if needed.  We will plan to see her back in 6 weeks for labs and follow-up.  She knows to call here with any questions or concerns. We can certainly see her sooner if need be.   Eliezer Bottom, NP 10/28/20162:32 PM

## 2014-12-20 NOTE — Patient Instructions (Signed)

## 2014-12-23 LAB — IRON AND TIBC CHCC
%SAT: 29 % (ref 21–57)
IRON: 80 ug/dL (ref 41–142)
TIBC: 276 ug/dL (ref 236–444)
UIBC: 196 ug/dL (ref 120–384)

## 2014-12-23 LAB — FERRITIN CHCC: Ferritin: 43 ng/ml (ref 9–269)

## 2015-02-19 ENCOUNTER — Other Ambulatory Visit: Payer: 59

## 2015-02-19 ENCOUNTER — Ambulatory Visit: Payer: 59 | Admitting: Family

## 2015-02-19 ENCOUNTER — Ambulatory Visit: Payer: 59

## 2015-05-26 ENCOUNTER — Ambulatory Visit: Payer: 59 | Admitting: Internal Medicine

## 2015-05-28 ENCOUNTER — Ambulatory Visit (INDEPENDENT_AMBULATORY_CARE_PROVIDER_SITE_OTHER): Payer: 59 | Admitting: Internal Medicine

## 2015-05-28 ENCOUNTER — Other Ambulatory Visit (INDEPENDENT_AMBULATORY_CARE_PROVIDER_SITE_OTHER): Payer: 59

## 2015-05-28 ENCOUNTER — Encounter: Payer: Self-pay | Admitting: Internal Medicine

## 2015-05-28 VITALS — BP 132/82 | HR 88 | Temp 98.3°F | Resp 16 | Ht 61.0 in | Wt 173.0 lb

## 2015-05-28 DIAGNOSIS — R591 Generalized enlarged lymph nodes: Secondary | ICD-10-CM

## 2015-05-28 DIAGNOSIS — K219 Gastro-esophageal reflux disease without esophagitis: Secondary | ICD-10-CM | POA: Insufficient documentation

## 2015-05-28 DIAGNOSIS — D219 Benign neoplasm of connective and other soft tissue, unspecified: Secondary | ICD-10-CM | POA: Insufficient documentation

## 2015-05-28 DIAGNOSIS — M2550 Pain in unspecified joint: Secondary | ICD-10-CM | POA: Insufficient documentation

## 2015-05-28 DIAGNOSIS — Z23 Encounter for immunization: Secondary | ICD-10-CM

## 2015-05-28 DIAGNOSIS — F988 Other specified behavioral and emotional disorders with onset usually occurring in childhood and adolescence: Secondary | ICD-10-CM | POA: Insufficient documentation

## 2015-05-28 DIAGNOSIS — Z833 Family history of diabetes mellitus: Secondary | ICD-10-CM | POA: Insufficient documentation

## 2015-05-28 DIAGNOSIS — R5383 Other fatigue: Secondary | ICD-10-CM | POA: Insufficient documentation

## 2015-05-28 DIAGNOSIS — R5382 Chronic fatigue, unspecified: Secondary | ICD-10-CM

## 2015-05-28 DIAGNOSIS — F32A Depression, unspecified: Secondary | ICD-10-CM | POA: Insufficient documentation

## 2015-05-28 DIAGNOSIS — F909 Attention-deficit hyperactivity disorder, unspecified type: Secondary | ICD-10-CM

## 2015-05-28 DIAGNOSIS — D509 Iron deficiency anemia, unspecified: Secondary | ICD-10-CM

## 2015-05-28 DIAGNOSIS — F329 Major depressive disorder, single episode, unspecified: Secondary | ICD-10-CM | POA: Diagnosis not present

## 2015-05-28 DIAGNOSIS — E669 Obesity, unspecified: Secondary | ICD-10-CM | POA: Insufficient documentation

## 2015-05-28 DIAGNOSIS — D259 Leiomyoma of uterus, unspecified: Secondary | ICD-10-CM

## 2015-05-28 LAB — COMPREHENSIVE METABOLIC PANEL
ALT: 17 U/L (ref 0–35)
AST: 19 U/L (ref 0–37)
Albumin: 4.3 g/dL (ref 3.5–5.2)
Alkaline Phosphatase: 58 U/L (ref 39–117)
BILIRUBIN TOTAL: 0.2 mg/dL (ref 0.2–1.2)
BUN: 10 mg/dL (ref 6–23)
CHLORIDE: 105 meq/L (ref 96–112)
CO2: 26 meq/L (ref 19–32)
CREATININE: 0.67 mg/dL (ref 0.40–1.20)
Calcium: 9.7 mg/dL (ref 8.4–10.5)
GFR: 128.07 mL/min (ref >60.00)
GLUCOSE: 89 mg/dL (ref 70–99)
Potassium: 4.3 mEq/L (ref 3.5–5.1)
Sodium: 137 mEq/L (ref 135–145)
TOTAL PROTEIN: 7.6 g/dL (ref 6.0–8.3)

## 2015-05-28 LAB — LIPID PANEL
CHOL/HDL RATIO: 5
CHOLESTEROL: 188 mg/dL (ref 0–200)
HDL: 39.7 mg/dL (ref >39.00)
LDL CALC: 131 mg/dL — AB (ref 0–99)
NonHDL: 148.57
TRIGLYCERIDES: 89 mg/dL (ref 0.0–149.0)
VLDL: 17.8 mg/dL (ref 0.0–40.0)

## 2015-05-28 LAB — CBC WITH DIFFERENTIAL/PLATELET
Basophils Absolute: 0 10*3/uL (ref 0.0–0.1)
Basophils Relative: 0.4 % (ref 0.0–3.0)
EOS PCT: 1.9 % (ref 0.0–5.0)
Eosinophils Absolute: 0.1 10*3/uL (ref 0.0–0.7)
HCT: 39.2 % (ref 36.0–46.0)
Hemoglobin: 13.2 g/dL (ref 12.0–15.0)
LYMPHS ABS: 2.5 10*3/uL (ref 0.7–4.0)
Lymphocytes Relative: 51.6 % — ABNORMAL HIGH (ref 12.0–46.0)
MCHC: 33.6 g/dL (ref 30.0–36.0)
MCV: 92.5 fl (ref 78.0–100.0)
MONOS PCT: 9.2 % (ref 3.0–12.0)
Monocytes Absolute: 0.4 10*3/uL (ref 0.1–1.0)
NEUTROS ABS: 1.8 10*3/uL (ref 1.4–7.7)
NEUTROS PCT: 36.9 % — AB (ref 43.0–77.0)
PLATELETS: 336 10*3/uL (ref 150.0–400.0)
RBC: 4.23 Mil/uL (ref 3.87–5.11)
RDW: 13.4 % (ref 11.5–15.5)
WBC: 4.9 10*3/uL (ref 4.0–10.5)

## 2015-05-28 LAB — HEMOGLOBIN A1C: Hgb A1c MFr Bld: 5.6 % (ref 4.6–6.5)

## 2015-05-28 LAB — FERRITIN: Ferritin: 24.5 ng/mL (ref 10.0–291.0)

## 2015-05-28 LAB — TSH: TSH: 0.54 u[IU]/mL (ref 0.35–4.50)

## 2015-05-28 LAB — IRON: Iron: 66 ug/dL (ref 42–145)

## 2015-05-28 MED ORDER — OMEPRAZOLE 20 MG PO CPDR: 20.0000 mg | DELAYED_RELEASE_CAPSULE | ORAL | Status: DC | PRN

## 2015-05-28 MED ORDER — VENLAFAXINE HCL ER 37.5 MG PO CP24: 37.5000 mg | ORAL_CAPSULE | Freq: Every day | ORAL | Status: DC

## 2015-05-28 MED ORDER — FLUTICASONE PROPIONATE 50 MCG/ACT NA SUSP: 1.0000 | NASAL | Status: DC | PRN

## 2015-05-28 MED ORDER — DULOXETINE HCL 30 MG PO CPEP: 30.0000 mg | ORAL_CAPSULE | Freq: Every day | ORAL | Status: DC

## 2015-05-28 MED ORDER — FISH OIL 1200 MG PO CAPS: ORAL_CAPSULE | ORAL | Status: AC

## 2015-05-28 MED ORDER — BUPROPION HCL ER (XL) 150 MG PO TB24: 150.0000 mg | ORAL_TABLET | Freq: Every day | ORAL | Status: DC

## 2015-05-28 NOTE — Progress Notes (Signed)
Pre visit review using our clinic review tool, if applicable. No additional management support is needed unless otherwise documented below in the visit note. 

## 2015-05-28 NOTE — Assessment & Plan Note (Signed)
Chronic abdominal lymphadenopathy, no change over one year.  ACE level normal.  PET negative. Oncology did not feel biopsy was needed - unlikely cancer.  Probably rheumatologic process

## 2015-05-28 NOTE — Patient Instructions (Addendum)
Start tapering off cymbalta; take 60 mg alternating with 30 mg, then take 30 mg daily after one week. Then after one week try taking every other day then stop after one more week. You can go slower or faster depending on symptoms you have.  Call with questions.    Start the effexor after you are off the cymbalta.     Test(s) ordered today. Your results will be released to [place] (or called to you) after review, usually within 72hours after test completion. If any changes need to be made, you will be notified at that same time.   Medications reviewed and updated.   Your prescription(s) have been submitted to your pharmacy. Please take as directed and contact our office if you believe you are having problem(s) with the medication(s).   Please followup in 2 months

## 2015-05-28 NOTE — Assessment & Plan Note (Signed)
Related to heavy menses/fibroids Check cbc, irons Has had iron infusions in past

## 2015-05-28 NOTE — Progress Notes (Signed)
Subjective:    Patient ID: Stephanie Werner, female    DOB: 17-Feb-1981, 35 y.o.   MRN: [id number]  HPI She is here to establish with a new pcp.   She is 35, but feels 75.  She is tired all the time.  She has fibroids and has had heavy menses.  She did have iron infusions.  She is unsure if she is currently anemic.    She is achy all over and had joint pain.   She has lymphadenopathy and is following with oncology.  The lymph nodes are small and it did not appear to be growing. Unlikely cancer.  Biopsy would require laproscopic or open surgery.  She has gained weight being on cymbalta and wants to come off of it.    GERD:  She is taking her medication daily as needed only.  She denies any GERD symptoms and feels her GERD is well controlled.   Depression:  She was taking wellbutrin and that helped,but caused increased irritability.  Cymbalta was added, but it cause weight gain.    Obese:  She does dance exercises 7 days a week.  She does not feel that she overeats.  She gained weight when she was started on cymbalta and wants to get off of it.    Fatigue:  She gets adequate sleep.    Medications and allergies reviewed with patient and updated if appropriate.  Patient Active Problem List   Diagnosis Date Noted  . Lymphadenopathy 06/27/2014    Current Outpatient Prescriptions on File Prior to Visit  Medication Sig Dispense Refill  . amphetamine-dextroamphetamine (ADDERALL) 20 MG tablet Take 20 mg by mouth 2 (two) times daily.    . B Complex Vitamins (VITAMIN B COMPLEX PO) Take by mouth every morning.    Marland Kitchen buPROPion (WELLBUTRIN XL) 150 MG 24 hr tablet     . DULoxetine (CYMBALTA) 60 MG capsule Take 60 mg by mouth daily.    . fluticasone (FLONASE) 50 MCG/ACT nasal spray Place 1 spray into both nostrils as needed.     Marland Kitchen omeprazole (PRILOSEC) 20 MG capsule Take 20 mg by mouth as needed.     . Zinc-Magnesium Aspart-Vit B6 (ZINC MAGNESIUM ASPARTATE PO) Take by mouth every morning.       No current facility-administered medications on file prior to visit.    Past Medical History  Diagnosis Date  . Anxiety   . Depression     History reviewed. No pertinent past surgical history.  Social History   Social History  . Marital Status: Single    Spouse Name: N/A  . Number of Children: N/A  . Years of Education: N/A   Social History Main Topics  . Smoking status: Never Smoker   . Smokeless tobacco: Never Used     Comment: never used tobacco  . Alcohol Use: No  . Drug Use: No  . Sexual Activity: Not Asked   Other Topics Concern  . None   Social History Narrative    Family History  Problem Relation Age of Onset  . Hypertension Mother   . Diabetes Mother   . Hyperlipidemia Mother   . Heart disease Mother   . Cancer Brother   . Diabetes Brother     Review of Systems  Constitutional: Positive for fatigue. Negative for fever and chills.       Excessive sweating - generalized, mostly face  Respiratory: Negative for cough, shortness of breath and wheezing.   Cardiovascular: Negative for chest  pain, palpitations and leg swelling.  Gastrointestinal: Negative for nausea and abdominal pain.  Genitourinary: Negative for dysuria and hematuria.  Musculoskeletal: Positive for back pain (soreness) and arthralgias (all joints hurt). Negative for myalgias and joint swelling.  Skin: Negative for rash.  Neurological: Negative for dizziness, light-headedness and headaches.  Psychiatric/Behavioral: Positive for dysphoric mood and decreased concentration.       Objective:   Filed Vitals:   05/28/15 0908  BP: 132/82  Pulse: 88  Temp: 98.3 F (36.8 C)  Resp: 16   Filed Weights   05/28/15 0908  Weight: 173 lb (78.472 kg)   Body mass index is 32.7 kg/(m^2).   Physical Exam Constitutional: She appears well-developed and well-nourished. No distress.  HENT:  Head: Normocephalic and atraumatic.  Right Ear: External ear normal. Normal ear canal and TM Left  Ear: External ear normal.  Normal ear canal and TM Mouth/Throat: Oropharynx is clear and moist.  Eyes: Conjunctivae and EOM are normal.  Neck: Neck supple. No tracheal deviation present. No thyromegaly present.  No carotid bruit  Cardiovascular: Normal rate, regular rhythm and normal heart sounds.   No murmur heard.  No edema. Pulmonary/Chest: Effort normal and breath sounds normal. No respiratory distress. She has no wheezes. She has no rales.  Abdominal: Soft. She exhibits no distension. There is no tenderness.  Lymphadenopathy: She has no cervical adenopathy.  Skin: Skin is warm and dry. She is not diaphoretic.  Psychiatric: She has a normal mood and affect. Her behavior is normal.         Assessment & Plan:   See Problem List for Assessment and Plan of chronic medical problems.  F/u in 2 months

## 2015-05-28 NOTE — Assessment & Plan Note (Signed)
She takes 20mg  of adderall three times a day Medication is effective, will continue

## 2015-05-28 NOTE — Assessment & Plan Note (Signed)
Check tsh Taper off cymbalta Continue regular exercise Decrease portions

## 2015-05-28 NOTE — Assessment & Plan Note (Signed)
Possibly multifactorial Continue supplements, regular exercise, good sleep Check blood work ? autotimmune related - should consider seeing rheum again -- will get prior rheum records

## 2015-05-28 NOTE — Assessment & Plan Note (Signed)
Has seen rheumatology - ? Lupus Has never been on medication for autoimmune process

## 2015-05-28 NOTE — Assessment & Plan Note (Addendum)
With heavy menses and iron def anemia Has not tolerated birth control

## 2015-05-28 NOTE — Assessment & Plan Note (Addendum)
Taking omeprazole only as needed Continue above

## 2015-06-02 ENCOUNTER — Telehealth: Payer: Self-pay | Admitting: Emergency Medicine

## 2015-06-02 NOTE — Telephone Encounter (Signed)
Yes she probably should but that is up to her -- I do not have their notes so it is hard for me to say.  If she feels she needs further evaluation of her fatigue and joint pain then yes she should go back

## 2015-06-02 NOTE — Telephone Encounter (Signed)
Spoke with pt to inform of lab results. Pt would like to know if you think its necessary to go back to Rheumotology.

## 2015-06-02 NOTE — Telephone Encounter (Signed)
-----   Message from Binnie Rail, MD sent at 05/30/2015  8:47 AM EDT ----- Your blood counts, thyroid function, sugar, kidney function and liver tests are normal.  There is no evidence of diabetes.  Your iron levels are normal.  Your cholesterol is very good.

## 2015-06-04 NOTE — Telephone Encounter (Signed)
Spoke with pt to inform of MDs response. Pt stated that she would like to wait until we receive the notes from her last Rheumotologist. She last saw him 6 months ago.

## 2015-07-23 ENCOUNTER — Telehealth: Payer: Self-pay

## 2015-07-23 NOTE — Telephone Encounter (Signed)
Patient called and wanted to know if you found out anything about her records? Please call her back when you can. Thank you.

## 2015-07-23 NOTE — Telephone Encounter (Signed)
Tried calling pr back to inform that we have not received any results. Unable to leave message due to full mail box

## 2015-07-28 ENCOUNTER — Encounter: Payer: Self-pay | Admitting: Internal Medicine

## 2015-07-28 ENCOUNTER — Ambulatory Visit (INDEPENDENT_AMBULATORY_CARE_PROVIDER_SITE_OTHER): Payer: 59 | Admitting: Internal Medicine

## 2015-07-28 VITALS — BP 144/80 | HR 103 | Temp 99.8°F | Resp 16 | Wt 180.0 lb

## 2015-07-28 DIAGNOSIS — R5382 Chronic fatigue, unspecified: Secondary | ICD-10-CM | POA: Diagnosis not present

## 2015-07-28 DIAGNOSIS — F909 Attention-deficit hyperactivity disorder, unspecified type: Secondary | ICD-10-CM | POA: Diagnosis not present

## 2015-07-28 DIAGNOSIS — F329 Major depressive disorder, single episode, unspecified: Secondary | ICD-10-CM

## 2015-07-28 DIAGNOSIS — E669 Obesity, unspecified: Secondary | ICD-10-CM | POA: Diagnosis not present

## 2015-07-28 DIAGNOSIS — F32A Depression, unspecified: Secondary | ICD-10-CM

## 2015-07-28 DIAGNOSIS — R591 Generalized enlarged lymph nodes: Secondary | ICD-10-CM

## 2015-07-28 DIAGNOSIS — F988 Other specified behavioral and emotional disorders with onset usually occurring in childhood and adolescence: Secondary | ICD-10-CM

## 2015-07-28 MED ORDER — PROMETHAZINE HCL 25 MG PO TABS: 25.0000 mg | ORAL_TABLET | Freq: Three times a day (TID) | ORAL | Status: DC | PRN

## 2015-07-28 MED ORDER — AMPHETAMINE-DEXTROAMPHETAMINE 20 MG PO TABS: 20.0000 mg | ORAL_TABLET | Freq: Two times a day (BID) | ORAL | Status: DC

## 2015-07-28 MED ORDER — VENLAFAXINE HCL 25 MG PO TABS: 25.0000 mg | ORAL_TABLET | Freq: Two times a day (BID) | ORAL | Status: DC

## 2015-07-28 MED ORDER — HYDROQUINONE 2 % EX CREA: TOPICAL_CREAM | Freq: Two times a day (BID) | CUTANEOUS | Status: DC

## 2015-07-28 NOTE — Progress Notes (Signed)
Subjective:    Patient ID: Stephanie Werner, female    DOB: 02-06-1981, 35 y.o.   MRN: [id number]  HPI She is here for follow up.   Fatigue:  She had blood work done after her last visit and she was not anemic, her iron levels were good and her thyroid function was normal - there was no explanation for her fatigue.  She continues to feel fatigued and it is chronic.    Depression, weight gain:  After being placed on cymbalta she gained weight and wanted to come off the medication -she has tapered off of it.  She has started taking the effexor.  She has gained weight with it and wants to come off of it.  She has been on several medications in the past and most have caused weight gain.  In the past month she gained 7 pounds and denies changes in her eating or exercising.  She is exercising regularly.  She states she has PTSD, major depression and anxiety.  She was following with a psychiatrist, but can not afford to see her because of the copay.  Her psychiatrist was filling out FMLA paperwork for her for her depression and anxiety.  She did not use if often but wonders if I was willing to fill it out. She also wanted her joint pain and fatigue put on the FMLA.    Her diet is not the best, but she does not think she eats too much.  She is exercising regularly.    ADD:  She is taking her medication as prescribed.  She feels the medication is effective.  She denies side effects, including palpitations, headaches, lightheadedness, decreased appetite and weight loss.    Medications and allergies reviewed with patient and updated if appropriate.  Patient Active Problem List   Diagnosis Date Noted  . GERD (gastroesophageal reflux disease) 05/28/2015  . ADD (attention deficit disorder) 05/28/2015  . Depression 05/28/2015  . Chronic fatigue 05/28/2015  . Joint pain 05/28/2015  . Family history of diabetes mellitus 05/28/2015  . Fibroid 05/28/2015  . Anemia, iron deficiency 05/28/2015  . Obese  05/28/2015  . Lymphadenopathy 06/27/2014    Current Outpatient Prescriptions on File Prior to Visit  Medication Sig Dispense Refill  . amphetamine-dextroamphetamine (ADDERALL) 20 MG tablet Take 20 mg by mouth 2 (two) times daily.    . B Complex Vitamins (VITAMIN B COMPLEX PO) Take by mouth every morning.    Marland Kitchen buPROPion (WELLBUTRIN XL) 150 MG 24 hr tablet Take 1 tablet (150 mg total) by mouth daily. 90 tablet 1  . fluticasone (FLONASE) 50 MCG/ACT nasal spray Place 1 spray into both nostrils as needed. 16 g 5  . Omega-3 Fatty Acids (FISH OIL) 1200 MG CAPS Taking daily    . omeprazole (PRILOSEC) 20 MG capsule Take 1 capsule (20 mg total) by mouth as needed. 90 capsule 3  . venlafaxine XR (EFFEXOR XR) 37.5 MG 24 hr capsule Take 1 capsule (37.5 mg total) by mouth daily with breakfast. 30 capsule 3  . Zinc-Magnesium Aspart-Vit B6 (ZINC MAGNESIUM ASPARTATE PO) Take by mouth every morning.     No current facility-administered medications on file prior to visit.    Past Medical History  Diagnosis Date  . Anxiety   . Depression     No past surgical history on file.  Social History   Social History  . Marital Status: Single    Spouse Name: N/A  . Number of Children: N/A  .  Years of Education: N/A   Social History Main Topics  . Smoking status: Never Smoker   . Smokeless tobacco: Never Used     Comment: never used tobacco  . Alcohol Use: No  . Drug Use: No  . Sexual Activity: Not on file   Other Topics Concern  . Not on file   Social History Narrative    Family History  Problem Relation Age of Onset  . Hypertension Mother   . Diabetes Mother   . Hyperlipidemia Mother   . Heart disease Mother   . Cancer Brother   . Diabetes Brother     Review of Systems  Constitutional: Positive for unexpected weight change (? related to effexor). Negative for appetite change.  Respiratory: Negative for shortness of breath.   Cardiovascular: Negative for chest pain and palpitations.    Gastrointestinal: Positive for nausea (with effexor).  Neurological: Positive for headaches (with effexor).  Psychiatric/Behavioral: Positive for dysphoric mood.       Objective:   Filed Vitals:   07/28/15 0842  BP: 144/80  Pulse: 103  Temp: 99.8 F (37.7 C)  Resp: 16   Filed Weights   07/28/15 0842  Weight: 180 lb (81.647 kg)   Body mass index is 34.03 kg/(m^2).   Physical Exam  Constitutional: She appears well-developed and well-nourished. No distress.  Skin: Skin is warm and dry. She is not diaphoretic.  Psychiatric: Her behavior is normal. Judgment and thought content normal.  Mood/affect depressed          Assessment & Plan:    See Problem List for Assessment and Plan of chronic medical problems.   F/u in 3 months

## 2015-07-28 NOTE — Assessment & Plan Note (Signed)
Some of her weight gain is secondary to anti-depressants, but her diet is also likely contributing Stressed decreasing calorie intake Continue regular exercise

## 2015-07-28 NOTE — Patient Instructions (Signed)
   Medications reviewed and updated.  Changes include decreasing the effexor to 25 mg twice daily for one week and then decrease to 25 mg once daily.  You can then decrease to every other day and then stop depending on what symptoms you have.  An anti-nausea medication was sent to your pharmacy in case you have nausea.  A bleaching medication was sent to your pharmacy - use as directed.  You may need to see a dermatologist if this give you any side effects or is not effective.    Your prescription(s) have been submitted to your pharmacy. Please take as directed and contact our office if you believe you are having problem(s) with the medication(s).   Please followup in 3 months

## 2015-07-28 NOTE — Assessment & Plan Note (Signed)
Blood work is normal ? Related to her depression Consider psych evaluation if her depression is not controlled Continue regular exercise

## 2015-07-28 NOTE — Assessment & Plan Note (Signed)
adderall refilled today - she typically takes it 1-2 times a day - will prescribe it only for twice a day -- she knows not to take it too late - it does interfere with her sleep if she takes it too late

## 2015-07-28 NOTE — Assessment & Plan Note (Signed)
She wants to come off the effexor due to weight gain -- will slowly taper off given headaches and nausea when forgetting a dose - given anti-nausea med to use if needed She just wants to stay on wellbutrin Encouraged her to re-establish with psych - her old one or a new one - to consider other options I have told her I will fill out FMLA temporarily only - 3 months for her depression/anxiety, but not fatigue/joint pain - if she needs this longer she will need to see a psychiatrist -- will give her 1-2 times a month for 1 day only Stressed to her if her depression is not controlled she needs to see psych

## 2015-07-28 NOTE — Progress Notes (Signed)
Pre visit review using our clinic review tool, if applicable. No additional management support is needed unless otherwise documented below in the visit note. 

## 2015-10-29 ENCOUNTER — Ambulatory Visit: Payer: 59 | Admitting: Internal Medicine

## 2015-10-29 NOTE — Progress Notes (Deleted)
Subjective:    Patient ID: Stephanie Werner, female    DOB: 1980/09/28, 35 y.o.   MRN: [id number]  HPI The patient is here for follow up.  GERD:  She is taking her medication as needed only.  She denies any GERD symptoms and feels her GERD is well controlled.   Depression: She has been on several medications in the past and most recently was on cymbalta and then effexor - both caused weight gain and that his why she came off of them.          Medications and allergies reviewed with patient and updated if appropriate.  Patient Active Problem List   Diagnosis Date Noted  . GERD (gastroesophageal reflux disease) 05/28/2015  . ADD (attention deficit disorder) 05/28/2015  . Depression 05/28/2015  . Chronic fatigue 05/28/2015  . Joint pain 05/28/2015  . Family history of diabetes mellitus 05/28/2015  . Fibroid 05/28/2015  . Anemia, iron deficiency 05/28/2015  . Obese 05/28/2015  . Lymphadenopathy 06/27/2014    Current Outpatient Prescriptions on File Prior to Visit  Medication Sig Dispense Refill  . amphetamine-dextroamphetamine (ADDERALL) 20 MG tablet Take 1 tablet (20 mg total) by mouth 2 (two) times daily. 60 tablet 0  . B Complex Vitamins (VITAMIN B COMPLEX PO) Take by mouth every morning.    Marland Kitchen buPROPion (WELLBUTRIN XL) 150 MG 24 hr tablet Take 1 tablet (150 mg total) by mouth daily. 90 tablet 1  . fluticasone (FLONASE) 50 MCG/ACT nasal spray Place 1 spray into both nostrils as needed. 16 g 5  . hydroquinone 2 % cream Apply topically 2 (two) times daily. 28.35 g 0  . Omega-3 Fatty Acids (FISH OIL) 1200 MG CAPS Taking daily    . omeprazole (PRILOSEC) 20 MG capsule Take 1 capsule (20 mg total) by mouth as needed. 90 capsule 3  . promethazine (PHENERGAN) 25 MG tablet Take 1 tablet (25 mg total) by mouth every 8 (eight) hours as needed for nausea or vomiting. 15 tablet 0  . venlafaxine (EFFEXOR) 25 MG tablet Take 1 tablet (25 mg total) by mouth 2 (two) times daily. 60 tablet  0  . Zinc-Magnesium Aspart-Vit B6 (ZINC MAGNESIUM ASPARTATE PO) Take by mouth every morning.     No current facility-administered medications on file prior to visit.     Past Medical History:  Diagnosis Date  . Anxiety   . Depression     No past surgical history on file.  Social History   Social History  . Marital status: Single    Spouse name: N/A  . Number of children: N/A  . Years of education: N/A   Social History Main Topics  . Smoking status: Never Smoker  . Smokeless tobacco: Never Used     Comment: never used tobacco  . Alcohol use No  . Drug use: No  . Sexual activity: Not on file   Other Topics Concern  . Not on file   Social History Narrative  . No narrative on file    Family History  Problem Relation Age of Onset  . Hypertension Mother   . Diabetes Mother   . Hyperlipidemia Mother   . Heart disease Mother   . Cancer Brother   . Diabetes Brother     Review of Systems     Objective:  There were no vitals filed for this visit. There were no vitals filed for this visit. There is no height or weight on file to calculate BMI.  Physical Exam    Constitutional: Appears well-developed and well-nourished. No distress.  HENT:  Head: Normocephalic and atraumatic.  Neck: Neck supple. No tracheal deviation present. No thyromegaly present.  Cardiovascular: Normal rate, regular rhythm and normal heart sounds.   No murmur heard. No carotid bruit  Pulmonary/Chest: Effort normal and breath sounds normal. No respiratory distress. No has no wheezes. No rales.  Musculoskeletal: No edema.  Lymphadenopathy: No cervical adenopathy.  Skin: Skin is warm and dry. Not diaphoretic.  Psychiatric: Normal mood and affect. Behavior is normal.     Assessment & Plan:    See Problem List for Assessment and Plan of chronic medical problems.

## 2015-11-05 ENCOUNTER — Ambulatory Visit (INDEPENDENT_AMBULATORY_CARE_PROVIDER_SITE_OTHER): Payer: 59 | Admitting: Psychology

## 2015-11-05 DIAGNOSIS — F331 Major depressive disorder, recurrent, moderate: Secondary | ICD-10-CM

## 2015-11-05 DIAGNOSIS — F411 Generalized anxiety disorder: Secondary | ICD-10-CM

## 2015-11-07 ENCOUNTER — Other Ambulatory Visit: Payer: Self-pay | Admitting: Internal Medicine

## 2015-11-07 NOTE — Telephone Encounter (Signed)
Do not see on pts current med list. Please advise.

## 2015-12-01 ENCOUNTER — Ambulatory Visit (INDEPENDENT_AMBULATORY_CARE_PROVIDER_SITE_OTHER): Payer: 59 | Admitting: Psychology

## 2015-12-01 DIAGNOSIS — F331 Major depressive disorder, recurrent, moderate: Secondary | ICD-10-CM | POA: Diagnosis not present

## 2015-12-01 DIAGNOSIS — F411 Generalized anxiety disorder: Secondary | ICD-10-CM | POA: Diagnosis not present

## 2015-12-06 ENCOUNTER — Other Ambulatory Visit: Payer: Self-pay | Admitting: Internal Medicine

## 2015-12-06 DIAGNOSIS — D509 Iron deficiency anemia, unspecified: Secondary | ICD-10-CM

## 2015-12-31 ENCOUNTER — Ambulatory Visit: Payer: 59 | Admitting: Psychology

## 2016-01-02 ENCOUNTER — Ambulatory Visit (INDEPENDENT_AMBULATORY_CARE_PROVIDER_SITE_OTHER): Payer: 59 | Admitting: Psychology

## 2016-01-02 DIAGNOSIS — F330 Major depressive disorder, recurrent, mild: Secondary | ICD-10-CM

## 2016-01-02 DIAGNOSIS — F411 Generalized anxiety disorder: Secondary | ICD-10-CM

## 2016-02-14 ENCOUNTER — Ambulatory Visit (INDEPENDENT_AMBULATORY_CARE_PROVIDER_SITE_OTHER): Payer: 59 | Admitting: Psychology

## 2016-02-14 DIAGNOSIS — F331 Major depressive disorder, recurrent, moderate: Secondary | ICD-10-CM

## 2016-02-14 DIAGNOSIS — F411 Generalized anxiety disorder: Secondary | ICD-10-CM | POA: Diagnosis not present

## 2016-02-14 DIAGNOSIS — F840 Autistic disorder: Secondary | ICD-10-CM

## 2016-03-09 ENCOUNTER — Ambulatory Visit (INDEPENDENT_AMBULATORY_CARE_PROVIDER_SITE_OTHER): Payer: 59 | Admitting: Psychology

## 2016-03-09 DIAGNOSIS — F840 Autistic disorder: Secondary | ICD-10-CM

## 2016-03-09 DIAGNOSIS — F331 Major depressive disorder, recurrent, moderate: Secondary | ICD-10-CM | POA: Diagnosis not present

## 2016-03-09 DIAGNOSIS — F411 Generalized anxiety disorder: Secondary | ICD-10-CM

## 2016-03-22 ENCOUNTER — Other Ambulatory Visit: Payer: Self-pay | Admitting: *Deleted

## 2016-03-22 DIAGNOSIS — D509 Iron deficiency anemia, unspecified: Secondary | ICD-10-CM

## 2016-03-23 ENCOUNTER — Other Ambulatory Visit (HOSPITAL_BASED_OUTPATIENT_CLINIC_OR_DEPARTMENT_OTHER): Payer: 59

## 2016-03-23 ENCOUNTER — Ambulatory Visit (HOSPITAL_BASED_OUTPATIENT_CLINIC_OR_DEPARTMENT_OTHER): Payer: 59 | Admitting: Family

## 2016-03-23 ENCOUNTER — Ambulatory Visit (HOSPITAL_BASED_OUTPATIENT_CLINIC_OR_DEPARTMENT_OTHER): Payer: 59

## 2016-03-23 VITALS — BP 121/71 | HR 91

## 2016-03-23 VITALS — BP 119/74 | HR 161 | Temp 98.4°F | Wt 185.5 lb

## 2016-03-23 DIAGNOSIS — D509 Iron deficiency anemia, unspecified: Secondary | ICD-10-CM

## 2016-03-23 DIAGNOSIS — D500 Iron deficiency anemia secondary to blood loss (chronic): Secondary | ICD-10-CM

## 2016-03-23 DIAGNOSIS — R591 Generalized enlarged lymph nodes: Secondary | ICD-10-CM

## 2016-03-23 LAB — CMP (CANCER CENTER ONLY)
ALBUMIN: 3.9 g/dL (ref 3.3–5.5)
ALT(SGPT): 28 U/L (ref 10–47)
AST: 25 U/L (ref 11–38)
Alkaline Phosphatase: 105 U/L — ABNORMAL HIGH (ref 26–84)
BILIRUBIN TOTAL: 0.5 mg/dL (ref 0.20–1.60)
BUN: 6 mg/dL — AB (ref 7–22)
CHLORIDE: 100 meq/L (ref 98–108)
CO2: 27 mEq/L (ref 18–33)
CREATININE: 0.9 mg/dL (ref 0.6–1.2)
Calcium: 9.7 mg/dL (ref 8.0–10.3)
Glucose, Bld: 95 mg/dL (ref 73–118)
Potassium: 3.6 mEq/L (ref 3.3–4.7)
SODIUM: 141 meq/L (ref 128–145)
TOTAL PROTEIN: 8.3 g/dL — AB (ref 6.4–8.1)

## 2016-03-23 LAB — CBC WITH DIFFERENTIAL (CANCER CENTER ONLY)
BASO#: 0 10*3/uL (ref 0.0–0.2)
BASO%: 0.2 % (ref 0.0–2.0)
EOS%: 1.5 % (ref 0.0–7.0)
Eosinophils Absolute: 0.1 10*3/uL (ref 0.0–0.5)
HCT: 39.5 % (ref 34.8–46.6)
HEMOGLOBIN: 13.4 g/dL (ref 11.6–15.9)
LYMPH#: 2.6 10*3/uL (ref 0.9–3.3)
LYMPH%: 48.3 % — AB (ref 14.0–48.0)
MCH: 30.2 pg (ref 26.0–34.0)
MCHC: 33.9 g/dL (ref 32.0–36.0)
MCV: 89 fL (ref 81–101)
MONO#: 0.6 10*3/uL (ref 0.1–0.9)
MONO%: 11 % (ref 0.0–13.0)
NEUT#: 2.1 10*3/uL (ref 1.5–6.5)
NEUT%: 39 % — ABNORMAL LOW (ref 39.6–80.0)
PLATELETS: 301 10*3/uL (ref 145–400)
RBC: 4.43 10*6/uL (ref 3.70–5.32)
RDW: 14.5 % (ref 11.1–15.7)
WBC: 5.3 10*3/uL (ref 3.9–10.0)

## 2016-03-23 LAB — IRON AND TIBC
%SAT: 21 % (ref 21–57)
Iron: 77 ug/dL (ref 41–142)
TIBC: 359 ug/dL (ref 236–444)
UIBC: 282 ug/dL (ref 120–384)

## 2016-03-23 LAB — LACTATE DEHYDROGENASE: LDH: 190 U/L (ref 125–245)

## 2016-03-23 LAB — FERRITIN: Ferritin: 12 ng/ml (ref 9–269)

## 2016-03-23 MED ORDER — SODIUM CHLORIDE 0.9 % IV SOLN
Freq: Once | INTRAVENOUS | Status: AC
  Administered 2016-03-23: 13:00:00 via INTRAVENOUS

## 2016-03-23 MED ORDER — FERUMOXYTOL INJECTION 510 MG/17 ML
510.0000 mg | Freq: Once | INTRAVENOUS | Status: AC
  Administered 2016-03-23: 510 mg via INTRAVENOUS
  Filled 2016-03-23: qty 17

## 2016-03-23 NOTE — Progress Notes (Addendum)
Hematology and Oncology Follow Up Visit  Stephanie Werner [id number] 03/25/1980 36 y.o. 03/23/2016   Principle Diagnosis:  Chronic abdominal lymphadenopathy Iron deficiency    Current Therapy:   IV iron as indicated     Interim History:  Stephanie Werner is here today for a follow-up. She is symptomatic of iron deficiency and had a ferritin of 3 earlier last week. She has tried taking an oral iron supplement in the past with no improvement. She has not received IV iron in over 1 1/2 years.  Her cycles continue to be heavy lasting 7 days. They are regular.  She c/o fatigue, weakness, SOB with exertion and generalized joint aches and pains.  No fever, chills, n/v, cough, rash, dizziness, chest pain, palpitations, abdominal pain or changes in bowel or bladder habits.  No lymphadenopathy found on exam. No bruising or petechiae.  No swelling, tenderness, numbness or tingling in her extremities.  She has maintained a good appetite and is staying well hydrated. Her weight is   Medications:  Allergies as of 03/23/2016      Reactions   Penicillins    rash      Medication List       Accurate as of 03/23/16 11:48 AM. Always use your most recent med list.          amphetamine-dextroamphetamine 20 MG tablet Commonly known as:  ADDERALL Take 1 tablet (20 mg total) by mouth 2 (two) times daily.   buPROPion 150 MG 24 hr tablet Commonly known as:  WELLBUTRIN XL TAKE 1 TABLET (150 MG TOTAL) BY MOUTH DAILY.   Fish Oil 1200 MG Caps Taking daily   fluticasone 50 MCG/ACT nasal spray Commonly known as:  FLONASE Place 1 spray into both nostrils as needed.   hydroquinone 2 % cream Apply topically 2 (two) times daily.   omeprazole 20 MG capsule Commonly known as:  PRILOSEC Take 1 capsule (20 mg total) by mouth as needed.   promethazine 25 MG tablet Commonly known as:  PHENERGAN Take 1 tablet (25 mg total) by mouth every 8 (eight) hours as needed for nausea or vomiting.   venlafaxine  XR 37.5 MG 24 hr capsule Commonly known as:  EFFEXOR-XR TAKE 1 CAPSULE (37.5 MG TOTAL) BY MOUTH DAILY WITH BREAKFAST.   VITAMIN B COMPLEX PO Take by mouth every morning.   ZINC MAGNESIUM ASPARTATE PO Take by mouth every morning.       Allergies:  Allergies  Allergen Reactions  . Penicillins     rash    Past Medical History, Surgical history, Social history, and Family History were reviewed and updated.  Review of Systems: All other 10 point review of systems is negative.   Physical Exam:  weight is 185 lb 8 oz (84.1 kg). Her oral temperature is 98.4 F (36.9 C). Her blood pressure is 119/74 and her pulse is 161 (abnormal).   Wt Readings from Last 3 Encounters:  03/23/16 185 lb 8 oz (84.1 kg)  07/28/15 180 lb (81.6 kg)  05/28/15 173 lb (78.5 kg)    Ocular: Sclerae unicteric, pupils equal, round and reactive to light Ear-nose-throat: Oropharynx clear, dentition fair Lymphatic: No cervical supraclavicular or axillary adenopathy Lungs no rales or rhonchi, good excursion bilaterally Heart regular rate and rhythm, no murmur appreciated Abd soft, nontender, positive bowel sounds, no liver or spleen tip palpated on exam, no fluid wave MSK no focal spinal tenderness, no joint edema Neuro: non-focal, well-oriented, appropriate affect Breasts: Deferred  Lab Results  Component  Value Date   WBC 5.3 03/23/2016   HGB 13.4 03/23/2016   HCT 39.5 03/23/2016   MCV 89 03/23/2016   PLT 301 03/23/2016   Lab Results  Component Value Date   FERRITIN 24.5 05/28/2015   IRON 66 05/28/2015   TIBC 276 12/20/2014   UIBC 196 12/20/2014   IRONPCTSAT 29 12/20/2014   Lab Results  Component Value Date   RBC 4.43 03/23/2016   No results found for: KPAFRELGTCHN, LAMBDASER, KAPLAMBRATIO No results found for: Kandis Cocking, IGMSERUM Lab Results  Component Value Date   TOTALPROTELP 7.7 08/01/2013   ALBUMINELP 51.5 (L) 08/01/2013   A1GS 3.7 08/01/2013   A2GS 9.9 08/01/2013   BETS 6.1  08/01/2013   BETA2SER 6.8 (H) 08/01/2013   GAMS 22.0 (H) 08/01/2013   MSPIKE NOT DET 08/01/2013   SPEI * 08/01/2013     Chemistry      Component Value Date/Time   NA 141 03/23/2016 1109   K 3.6 03/23/2016 1109   CL 100 03/23/2016 1109   CO2 27 03/23/2016 1109   BUN 6 (L) 03/23/2016 1109   CREATININE 0.9 03/23/2016 1109      Component Value Date/Time   CALCIUM 9.7 03/23/2016 1109   ALKPHOS 105 (H) 03/23/2016 1109   AST 25 03/23/2016 1109   ALT 28 03/23/2016 1109   BILITOT 0.50 03/23/2016 1109     Impression and Plan: Stephanie Werner is a 36 yo African-American female with chronic abdominal lymphadenopathy and iron deficiency anemia secondary to menorrhagia.  Her ferritin was 3 last week. She is symptomatic with fatigue, weakness, SOB with exertion and joint aches.  She has limited time off from work and used up the last of her PAL to come in today for follow-up and would like to receive her IV iron today if possible. We will do her infusion today.  We will continue to follow along with her as needed per her preference.  She will contact our office with any questions or concerns. We can certainly see her sooner if need be.   Eliezer Bottom, NP 1/30/201811:48 AM

## 2016-03-23 NOTE — Patient Instructions (Signed)
Ferumoxytol injection What is this medicine? FERUMOXYTOL is an iron complex. Iron is used to make healthy red blood cells, which carry oxygen and nutrients throughout the body. This medicine is used to treat iron deficiency anemia in people with chronic kidney disease. COMMON BRAND NAME(S): Feraheme What should I tell my health care provider before I take this medicine? They need to know if you have any of these conditions: -anemia not caused by low iron levels -high levels of iron in the blood -magnetic resonance imaging (MRI) test scheduled -an unusual or allergic reaction to iron, other medicines, foods, dyes, or preservatives -pregnant or trying to get pregnant -breast-feeding How should I use this medicine? This medicine is for injection into a vein. It is given by a health care professional in a hospital or clinic setting. Talk to your pediatrician regarding the use of this medicine in children. Special care may be needed. What if I miss a dose? It is important not to miss your dose. Call your doctor or health care professional if you are unable to keep an appointment. What may interact with this medicine? This medicine may interact with the following medications: -other iron products What should I watch for while using this medicine? Visit your doctor or healthcare professional regularly. Tell your doctor or healthcare professional if your symptoms do not start to get better or if they get worse. You may need blood work done while you are taking this medicine. You may need to follow a special diet. Talk to your doctor. Foods that contain iron include: whole grains/cereals, dried fruits, beans, or peas, leafy green vegetables, and organ meats (liver, kidney). What side effects may I notice from receiving this medicine? Side effects that you should report to your doctor or health care professional as soon as possible: -allergic reactions like skin rash, itching or hives, swelling of the  face, lips, or tongue -breathing problems -changes in blood pressure -feeling faint or lightheaded, falls -fever or chills -flushing, sweating, or hot feelings -swelling of the ankles or feet Side effects that usually do not require medical attention (report to your doctor or health care professional if they continue or are bothersome): -diarrhea -headache -nausea, vomiting -stomach pain Where should I keep my medicine? This drug is given in a hospital or clinic and will not be stored at home.  2017 Elsevier/Gold Standard (2015-03-13 12:41:49)  

## 2016-04-22 ENCOUNTER — Ambulatory Visit: Payer: Self-pay | Admitting: Psychology

## 2016-04-26 ENCOUNTER — Other Ambulatory Visit: Payer: Self-pay | Admitting: Internal Medicine

## 2016-04-26 DIAGNOSIS — D509 Iron deficiency anemia, unspecified: Secondary | ICD-10-CM

## 2016-05-07 ENCOUNTER — Other Ambulatory Visit: Payer: Self-pay | Admitting: Internal Medicine

## 2016-05-07 DIAGNOSIS — R591 Generalized enlarged lymph nodes: Secondary | ICD-10-CM

## 2016-05-25 ENCOUNTER — Other Ambulatory Visit: Payer: Self-pay | Admitting: Internal Medicine

## 2016-05-25 DIAGNOSIS — D509 Iron deficiency anemia, unspecified: Secondary | ICD-10-CM

## 2016-05-30 ENCOUNTER — Other Ambulatory Visit: Payer: Self-pay | Admitting: Internal Medicine

## 2016-05-30 DIAGNOSIS — D509 Iron deficiency anemia, unspecified: Secondary | ICD-10-CM

## 2016-07-09 ENCOUNTER — Telehealth: Payer: Self-pay | Admitting: Internal Medicine

## 2016-07-09 ENCOUNTER — Ambulatory Visit (INDEPENDENT_AMBULATORY_CARE_PROVIDER_SITE_OTHER): Payer: 59 | Admitting: Nurse Practitioner

## 2016-07-09 ENCOUNTER — Encounter: Payer: Self-pay | Admitting: Nurse Practitioner

## 2016-07-09 VITALS — BP 134/84 | HR 104 | Temp 98.5°F | Ht 61.0 in | Wt 179.0 lb

## 2016-07-09 DIAGNOSIS — J0141 Acute recurrent pansinusitis: Secondary | ICD-10-CM | POA: Diagnosis not present

## 2016-07-09 DIAGNOSIS — J309 Allergic rhinitis, unspecified: Secondary | ICD-10-CM

## 2016-07-09 MED ORDER — AZITHROMYCIN 250 MG PO TABS: 250.0000 mg | ORAL_TABLET | Freq: Every day | ORAL | 0 refills | Status: DC

## 2016-07-09 MED ORDER — METHYLPREDNISOLONE 4 MG PO TBPK: ORAL_TABLET | ORAL | 0 refills | Status: DC

## 2016-07-09 MED ORDER — GUAIFENESIN ER 600 MG PO TB12: 600.0000 mg | ORAL_TABLET | Freq: Two times a day (BID) | ORAL | 0 refills | Status: DC | PRN

## 2016-07-09 MED ORDER — FEXOFENADINE HCL 180 MG PO TABS: 180.0000 mg | ORAL_TABLET | Freq: Every day | ORAL | 0 refills | Status: DC

## 2016-07-09 MED ORDER — METHYLPREDNISOLONE ACETATE 80 MG/ML IJ SUSP
80.0000 mg | Freq: Once | INTRAMUSCULAR | Status: AC
  Administered 2016-07-09: 80 mg via INTRAMUSCULAR

## 2016-07-09 MED ORDER — OXYMETAZOLINE HCL 0.05 % NA SOLN: 1.0000 | Freq: Two times a day (BID) | NASAL | 0 refills | Status: DC

## 2016-07-09 MED ORDER — SALINE SPRAY 0.65 % NA SOLN: 1.0000 | NASAL | 0 refills | Status: AC | PRN

## 2016-07-09 NOTE — Progress Notes (Signed)
Subjective:  Patient ID: Stephanie Werner, female    DOB: March 23, 1980  Age: 36 y.o. MRN: [id number]  CC: URI (congetion,coughing yellow/blood mucus,headache,sore throat,sweating, going for 3 days. took OTC)   URI   This is a recurrent problem. The current episode started 1 to 4 weeks ago. The problem has been waxing and waning. There has been no fever. Associated symptoms include congestion, coughing, ear pain, headaches, a plugged ear sensation, rhinorrhea, sinus pain, sneezing, a sore throat and swollen glands. She has tried antihistamine, decongestant, increased fluids, NSAIDs, steam and acetaminophen for the symptoms. The treatment provided mild relief.    Outpatient Medications Prior to Visit  Medication Sig Dispense Refill  . amphetamine-dextroamphetamine (ADDERALL) 20 MG tablet Take 1 tablet (20 mg total) by mouth 2 (two) times daily. 60 tablet 0  . B Complex Vitamins (VITAMIN B COMPLEX PO) Take by mouth every morning.    . fluticasone (FLONASE) 50 MCG/ACT nasal spray Place 1 spray into both nostrils as needed. 16 g 5  . Omega-3 Fatty Acids (FISH OIL) 1200 MG CAPS Taking daily    . omeprazole (PRILOSEC) 20 MG capsule Take 1 capsule (20 mg total) by mouth as needed. 90 capsule 3  . Zinc-Magnesium Aspart-Vit B6 (ZINC MAGNESIUM ASPARTATE PO) Take by mouth every morning.    Marland Kitchen buPROPion (WELLBUTRIN XL) 150 MG 24 hr tablet Take 1 tablet (150 mg total) by mouth daily. Yearly physical due in April must see MD for refills (Patient not taking: Reported on 07/09/2016) 30 tablet 0  . hydroquinone 2 % cream Apply topically 2 (two) times daily. (Patient not taking: Reported on 07/09/2016) 28.35 g 0  . promethazine (PHENERGAN) 25 MG tablet Take 1 tablet (25 mg total) by mouth every 8 (eight) hours as needed for nausea or vomiting. (Patient not taking: Reported on 07/09/2016) 15 tablet 0  . venlafaxine XR (EFFEXOR-XR) 37.5 MG 24 hr capsule TAKE 1 CAPSULE (37.5 MG TOTAL) BY MOUTH DAILY WITH BREAKFAST.  (Patient not taking: Reported on 03/23/2016) 30 capsule 3   No facility-administered medications prior to visit.     ROS See HPI  Objective:  BP 134/84   Pulse (!) 104   Temp 98.5 F (36.9 C)   Ht 5\' 1"  (1.549 m)   Wt 179 lb (81.2 kg)   SpO2 98%   BMI 33.82 kg/m   BP Readings from Last 3 Encounters:  07/09/16 134/84  03/23/16 121/71  03/23/16 119/74    Wt Readings from Last 3 Encounters:  07/09/16 179 lb (81.2 kg)  03/23/16 185 lb 8 oz (84.1 kg)  07/28/15 180 lb (81.6 kg)    Physical Exam  Constitutional: She is oriented to person, place, and time.  HENT:  Right Ear: Tympanic membrane, external ear and ear canal normal.  Left Ear: Tympanic membrane, external ear and ear canal normal.  Nose: Mucosal edema and rhinorrhea present. Right sinus exhibits maxillary sinus tenderness. Right sinus exhibits no frontal sinus tenderness. Left sinus exhibits maxillary sinus tenderness. Left sinus exhibits no frontal sinus tenderness.  Mouth/Throat: Uvula is midline. No trismus in the jaw. Posterior oropharyngeal erythema present. No oropharyngeal exudate.  Eyes: No scleral icterus.  Neck: Normal range of motion. Neck supple.  Cardiovascular: Normal rate and normal heart sounds.   Pulmonary/Chest: Effort normal and breath sounds normal.  Musculoskeletal: She exhibits no edema.  Lymphadenopathy:    She has cervical adenopathy.  Neurological: She is alert and oriented to person, place, and time.  Vitals reviewed.  Lab Results  Component Value Date   WBC 5.3 03/23/2016   HGB 13.4 03/23/2016   HCT 39.5 03/23/2016   PLT 301 03/23/2016   GLUCOSE 95 03/23/2016   CHOL 188 05/28/2015   TRIG 89.0 05/28/2015   HDL 39.70 05/28/2015   LDLCALC 131 (H) 05/28/2015   ALT 28 03/23/2016   AST 25 03/23/2016   NA 141 03/23/2016   K 3.6 03/23/2016   CL 100 03/23/2016   CREATININE 0.9 03/23/2016   BUN 6 (L) 03/23/2016   CO2 27 03/23/2016   TSH 0.54 05/28/2015   HGBA1C 5.6 05/28/2015     Ct Maxillofacial Wo Cm  Result Date: 12/05/2014 CLINICAL DATA:  Acute sinusitis EXAM: CT MAXILLOFACIAL WITHOUT CONTRAST TECHNIQUE: Multidetector CT imaging of the maxillofacial structures was performed. Multiplanar CT image reconstructions were also generated. A small metallic BB was placed on the right temple in order to reliably differentiate right from left. COMPARISON:  None. FINDINGS: . Mild mucosal edema right ostiomeatal complex causing narrowing of the maxillary ostium. Left ostiomeatal complex patent. Maxillary sinus otherwise patent bilaterally. Frontal sinuses hypoplastic. Ethmoid and sphenoid sinuses clear bilaterally. No air-fluid level Mastoid sinus clear bilaterally.  Middle ear clear. Central skullbase intact. Limited intracranial imaging normal. Regional soft tissues are normal. Moderate degenerative change left TMJ with cartilage loss joint space narrowing and subchondral cyst formation in the left condyle. IMPRESSION: Right ostiomeatal complex is narrowed due to mild mucosal edema. Remaining sinuses are clear. Electronically Signed   By: Franchot Gallo M.D.   On: 12/05/2014 12:42    Assessment & Plan:   Stephanie Werner was seen today for uri.  Diagnoses and all orders for this visit:  Acute recurrent pansinusitis -     oxymetazoline (AFRIN NASAL SPRAY) 0.05 % nasal spray; Place 1 spray into both nostrils 2 (two) times daily. Use only for 3days, then stop -     guaiFENesin (MUCINEX) 600 MG 12 hr tablet; Take 1 tablet (600 mg total) by mouth 2 (two) times daily as needed for cough or to loosen phlegm. -     sodium chloride (OCEAN) 0.65 % SOLN nasal spray; Place 1 spray into both nostrils as needed for congestion. -     azithromycin (ZITHROMAX Z-PAK) 250 MG tablet; Take 1 tablet (250 mg total) by mouth daily. Take 2tabs on first day, then 1tab once a day till complete -     methylPREDNISolone acetate (DEPO-MEDROL) injection 80 mg; Inject 1 mL (80 mg total) into the muscle once. -      Ambulatory referral to Allergy -     methylPREDNISolone (MEDROL DOSEPAK) 4 MG TBPK tablet; Take as directed on package  Allergic rhinitis, unspecified seasonality, unspecified trigger -     methylPREDNISolone acetate (DEPO-MEDROL) injection 80 mg; Inject 1 mL (80 mg total) into the muscle once. -     Ambulatory referral to Allergy -     methylPREDNISolone (MEDROL DOSEPAK) 4 MG TBPK tablet; Take as directed on package -     fexofenadine (ALLEGRA) 180 MG tablet; Take 1 tablet (180 mg total) by mouth daily.   I have discontinued Ms. Loose's promethazine, hydroquinone, and venlafaxine XR. I am also having her start on oxymetazoline, guaiFENesin, sodium chloride, azithromycin, methylPREDNISolone, and fexofenadine. Additionally, I am having her maintain her B Complex Vitamins (VITAMIN B COMPLEX PO), Zinc-Magnesium Aspart-Vit B6 (ZINC MAGNESIUM ASPARTATE PO), omeprazole, Fish Oil, fluticasone, amphetamine-dextroamphetamine, buPROPion, and FETZIMA. We administered methylPREDNISolone acetate.  Meds ordered this encounter  Medications  . FETZIMA 120  MG CP24    Sig: Take 1 capsule by mouth daily.    Refill:  12  . oxymetazoline (AFRIN NASAL SPRAY) 0.05 % nasal spray    Sig: Place 1 spray into both nostrils 2 (two) times daily. Use only for 3days, then stop    Dispense:  30 mL    Refill:  0    Order Specific Question:   Supervising Provider    Answer:   Cassandria Anger [[id number]]  . guaiFENesin (MUCINEX) 600 MG 12 hr tablet    Sig: Take 1 tablet (600 mg total) by mouth 2 (two) times daily as needed for cough or to loosen phlegm.    Dispense:  14 tablet    Refill:  0    Order Specific Question:   Supervising Provider    Answer:   Cassandria Anger [[id number]]  . sodium chloride (OCEAN) 0.65 % SOLN nasal spray    Sig: Place 1 spray into both nostrils as needed for congestion.    Dispense:  15 mL    Refill:  0    Order Specific Question:   Supervising Provider    Answer:   Cassandria Anger  [[id number]]  . azithromycin (ZITHROMAX Z-PAK) 250 MG tablet    Sig: Take 1 tablet (250 mg total) by mouth daily. Take 2tabs on first day, then 1tab once a day till complete    Dispense:  6 tablet    Refill:  0    Order Specific Question:   Supervising Provider    Answer:   Cassandria Anger [[id number]]  . methylPREDNISolone acetate (DEPO-MEDROL) injection 80 mg  . methylPREDNISolone (MEDROL DOSEPAK) 4 MG TBPK tablet    Sig: Take as directed on package    Dispense:  21 tablet    Refill:  0    Order Specific Question:   Supervising Provider    Answer:   Cassandria Anger [[id number]]  . fexofenadine (ALLEGRA) 180 MG tablet    Sig: Take 1 tablet (180 mg total) by mouth daily.    Dispense:  30 tablet    Refill:  0    Order Specific Question:   Supervising Provider    Answer:   Cassandria Anger [[id number]]    Follow-up: Return if symptoms worsen or fail to improve.  Wilfred Lacy, NP

## 2016-07-09 NOTE — Telephone Encounter (Signed)
Patient Name: Stephanie Werner DOB: 08-07-1980 Initial Comment Caller says, thinks she has a fever, sore throat, yellow nasal mucus, coughing up mucus / blood. She is sweating now. Nurse Assessment Nurse: Erling Cruz, RN, More[facility] Hummingbird Date/Time (Eastern Time): 07/09/2016 8:06:39 AM Confirm and document reason for call. If symptomatic, describe symptoms. ---Caller states she thinks she has a fever, sore throat, yellow nasal mucus, coughing up mucus tinged w/ blood, sweating, lymph node in neck is swollen, sinus pressure. Taking Flonase & Allegra D, Tylenol Flu/Cold. Started Tuesday. Does the patient have any new or worsening symptoms? ---Yes Will a triage be completed? ---Yes Related visit to physician within the last 2 weeks? ---No Does the PT have any chronic conditions? (i.e. diabetes, asthma, etc.) ---No Is the patient pregnant or possibly pregnant? (Ask all females between the ages of 29-55) ---No Is this a behavioral health or substance abuse call? ---No Guidelines Guideline Title Affirmed Question Affirmed Notes Sinus Pain or Congestion Fever present > 3 days (72 hours) Final Disposition User See Physician within 24 Hours Love, RN, Morey Hummingbird Comments Appointment scheduled for 10:15 today with Baldo Ash NP. Referrals REFERRED TO PCP OFFICE Disagree/Comply: Comply

## 2016-07-09 NOTE — Patient Instructions (Addendum)
URI Instructions: Flonase and Afrin use: apply 1spray of afrin in each nare, wait 59mins, then apply 2sprays of flonase in each nare. Use both nasal spray consecutively x 3days, then flonase only for at least 14days.  Encourage adequate oral hydration.  Use over-the-counter  "cold" medicines  such as "Tylenol cold" , "Advil cold",  "Mucinex" or" Mucinex D"  for cough and congestion.  Avoid decongestants if you have high blood pressure. Use" Delsym" or" Robitussin" cough syrup varietis for cough.  You can use plain "Tylenol" or "Advi"l for fever, chills and achyness.  Start oral prednisone tomorrow. You will be contacted with appt to Allergist.

## 2016-07-15 NOTE — Progress Notes (Signed)
Subjective:    Patient ID: Stephanie Werner, female    DOB: 12-21-1980, 36 y.o.   MRN: [id number]  HPI She is here for a physical exam.   Sinus infection:  She saw [facility] last week.  She states no improvement since then.  She was prescribed zpak and medrol dose pak and there has been no improvement.  She still has nasal congestion with yellow mucus, she has been sweating intermittently, coughing and has hoarseness.  She is not able to taste.  She feels miserable.  Fatigue:  She has chronic fatigue that is persistent.  90% of the time she does not feel well.  This started approximately 3 years ago and no cause has been identified. She was low on certain vitamins, but she is taking her vitamins regularly.  She is not able to exercise because of the fatigue. She is unsure if she snores or not. She has never been checked for sleep apnea. She is unsure if she has ever been bite by a tick.  Medications and allergies reviewed with patient and updated if appropriate.  Patient Active Problem List   Diagnosis Date Noted  . IDA (iron deficiency anemia) 03/23/2016  . GERD (gastroesophageal reflux disease) 05/28/2015  . ADD (attention deficit disorder) 05/28/2015  . Depression 05/28/2015  . Chronic fatigue 05/28/2015  . Joint pain 05/28/2015  . Family history of diabetes mellitus 05/28/2015  . Fibroid 05/28/2015  . Anemia, iron deficiency 05/28/2015  . Obese 05/28/2015  . Lymphadenopathy 06/27/2014    Current Outpatient Prescriptions on File Prior to Visit  Medication Sig Dispense Refill  . amphetamine-dextroamphetamine (ADDERALL) 20 MG tablet Take 1 tablet (20 mg total) by mouth 2 (two) times daily. 60 tablet 0  . B Complex Vitamins (VITAMIN B COMPLEX PO) Take by mouth every morning.    Marland Kitchen FETZIMA 120 MG CP24 Take 1 capsule by mouth daily.  12  . fexofenadine (ALLEGRA) 180 MG tablet Take 1 tablet (180 mg total) by mouth daily. 30 tablet 0  . fluticasone (FLONASE) 50 MCG/ACT nasal spray  Place 1 spray into both nostrils as needed. 16 g 5  . guaiFENesin (MUCINEX) 600 MG 12 hr tablet Take 1 tablet (600 mg total) by mouth 2 (two) times daily as needed for cough or to loosen phlegm. 14 tablet 0  . methylPREDNISolone (MEDROL DOSEPAK) 4 MG TBPK tablet Take as directed on package 21 tablet 0  . Omega-3 Fatty Acids (FISH OIL) 1200 MG CAPS Taking daily    . omeprazole (PRILOSEC) 20 MG capsule Take 1 capsule (20 mg total) by mouth as needed. 90 capsule 3  . oxymetazoline (AFRIN NASAL SPRAY) 0.05 % nasal spray Place 1 spray into both nostrils 2 (two) times daily. Use only for 3days, then stop 30 mL 0  . sodium chloride (OCEAN) 0.65 % SOLN nasal spray Place 1 spray into both nostrils as needed for congestion. 15 mL 0  . Zinc-Magnesium Aspart-Vit B6 (ZINC MAGNESIUM ASPARTATE PO) Take by mouth every morning.     No current facility-administered medications on file prior to visit.     Past Medical History:  Diagnosis Date  . Anxiety   . Depression     No past surgical history on file.  Social History   Social History  . Marital status: Single    Spouse name: N/A  . Number of children: N/A  . Years of education: N/A   Social History Main Topics  . Smoking status: Never Smoker  .  Smokeless tobacco: Never Used     Comment: never used tobacco  . Alcohol use No  . Drug use: No  . Sexual activity: Not on file   Other Topics Concern  . Not on file   Social History Narrative  . No narrative on file    Family History  Problem Relation Age of Onset  . Hypertension Mother   . Diabetes Mother   . Hyperlipidemia Mother   . Heart disease Mother   . Cancer Brother   . Diabetes Brother     Review of Systems  Constitutional: Positive for diaphoresis and fatigue. Negative for chills and fever.  HENT: Positive for congestion (yellow mucus) and voice change. Negative for ear pain, sinus pain, sinus pressure and sore throat.        Can not taste anything  Eyes: Negative for  visual disturbance.  Respiratory: Positive for cough (occ cough). Negative for shortness of breath and wheezing.   Cardiovascular: Negative for chest pain, palpitations and leg swelling.  Gastrointestinal: Negative for abdominal pain, blood in stool, constipation, diarrhea and nausea.  Genitourinary: Negative for dysuria and hematuria.  Musculoskeletal: Positive for arthralgias (worse during menstraul cycle, less frequent - when it occurs pain is severe). Negative for back pain and joint swelling.  Skin: Negative for color change and rash.  Neurological: Negative for dizziness, light-headedness and headaches.  Psychiatric/Behavioral: Positive for dysphoric mood (not controlled). Negative for sleep disturbance. The patient is nervous/anxious.        Objective:   Vitals:   07/16/16 1505  BP: 108/76  Pulse: 98  Resp: 16  Temp: 98.6 F (37 C)   Filed Weights   07/16/16 1505  Weight: 177 lb (80.3 kg)   Body mass index is 33.44 kg/m.  Wt Readings from Last 3 Encounters:  07/16/16 177 lb (80.3 kg)  07/09/16 179 lb (81.2 kg)  03/23/16 185 lb 8 oz (84.1 kg)     Physical Exam Constitutional: She appears well-developed and well-nourished. No distress.  HENT:  Head: Normocephalic and atraumatic.  Right Ear: External ear normal. Normal ear canal and TM Left Ear: External ear normal.  Normal ear canal and TM Mouth/Throat: Oropharynx is clear and moist.  Eyes: Conjunctivae and EOM are normal.  Neck: Neck supple. No tracheal deviation present. No thyromegaly present.  No carotid bruit  Cardiovascular: Normal rate, regular rhythm and normal heart sounds.   No murmur heard.  No edema. Pulmonary/Chest: Effort normal and breath sounds normal. No respiratory distress. She has no wheezes. She has no rales.  Breast: deferred to Gyn Abdominal: Soft. She exhibits no distension. There is no tenderness.  Lymphadenopathy: She has no cervical adenopathy.  Skin: Skin is warm and dry. She is not  diaphoretic.  Psychiatric: She has a normal mood and affect. Her behavior is normal.         Assessment & Plan:   Physical exam: Screening blood work ordered Immunizations  Up to date  Gyn    Up to date  Exercise  - none - does not have the energy for it Weight  Encouraged weight loss Skin    No concerns Substance abuse   none  See Problem List for Assessment and Plan of chronic medical problems.

## 2016-07-15 NOTE — Patient Instructions (Addendum)
Test(s) ordered today. Your results will be released to [place] (or called to you) after review, usually within 72hours after test completion. If any changes need to be made, you will be notified at that same time.  All other Health Maintenance issues reviewed.   All recommended immunizations and age-appropriate screenings are up-to-date or discussed.  No immunizations administered today.   Medications reviewed and updated.  Changes include taking doxycycline for your sinus infection.  Your prescription(s) have been submitted to your pharmacy. Please take as directed and contact our office if you believe you are having problem(s) with the medication(s).  A referral was ordered for pulmonary for evaluation of sleep apnea.  Please followup if there is no improvement in your symptoms   Health Maintenance, Female Adopting a healthy lifestyle and getting preventive care can go a long way to promote health and wellness. Talk with your health care provider about what schedule of regular examinations is right for you. This is a good chance for you to check in with your provider about disease prevention and staying healthy. In between checkups, there are plenty of things you can do on your own. Experts have done a lot of research about which lifestyle changes and preventive measures are most likely to keep you healthy. Ask your health care provider for more information. Weight and diet Eat a healthy diet  Be sure to include plenty of vegetables, fruits, low-fat dairy products, and lean protein.  Do not eat a lot of foods high in solid fats, added sugars, or salt.  Get regular exercise. This is one of the most important things you can do for your health.  Most adults should exercise for at least 150 minutes each week. The exercise should increase your heart rate and make you sweat (moderate-intensity exercise).  Most adults should also do strengthening exercises at least twice a week. This is in  addition to the moderate-intensity exercise. Maintain a healthy weight  Body mass index (BMI) is a measurement that can be used to identify possible weight problems. It estimates body fat based on height and weight. Your health care provider can help determine your BMI and help you achieve or maintain a healthy weight.  For females 42 years of age and older:  A BMI below 18.5 is considered underweight.  A BMI of 18.5 to 24.9 is normal.  A BMI of 25 to 29.9 is considered overweight.  A BMI of 30 and above is considered obese. Watch levels of cholesterol and blood lipids  You should start having your blood tested for lipids and cholesterol at 37 years of age, then have this test every 5 years.  You may need to have your cholesterol levels checked more often if:  Your lipid or cholesterol levels are high.  You are older than 37 years of age.  You are at high risk for heart disease. Cancer screening Lung Cancer  Lung cancer screening is recommended for adults 8-59 years old who are at high risk for lung cancer because of a history of smoking.  A yearly low-dose CT scan of the lungs is recommended for people who:  Currently smoke.  Have quit within the past 15 years.  Have at least a 30-pack-year history of smoking. A pack year is smoking an average of one pack of cigarettes a day for 1 year.  Yearly screening should continue until it has been 15 years since you quit.  Yearly screening should stop if you develop a health problem that  would prevent you from having lung cancer treatment. Breast Cancer  Practice breast self-awareness. This means understanding how your breasts normally appear and feel.  It also means doing regular breast self-exams. Let your health care provider know about any changes, no matter how small.  If you are in your 20s or 30s, you should have a clinical breast exam (CBE) by a health care provider every 1-3 years as part of a regular health  exam.  If you are 30 or older, have a CBE every year. Also consider having a breast X-ray (mammogram) every year.  If you have a family history of breast cancer, talk to your health care provider about genetic screening.  If you are at high risk for breast cancer, talk to your health care provider about having an MRI and a mammogram every year.  Breast cancer gene (BRCA) assessment is recommended for women who have family members with BRCA-related cancers. BRCA-related cancers include:  Breast.  Ovarian.  Tubal.  Peritoneal cancers.  Results of the assessment will determine the need for genetic counseling and BRCA1 and BRCA2 testing. Cervical Cancer  Your health care provider may recommend that you be screened regularly for cancer of the pelvic organs (ovaries, uterus, and vagina). This screening involves a pelvic examination, including checking for microscopic changes to the surface of your cervix (Pap test). You may be encouraged to have this screening done every 3 years, beginning at age 52.  For women ages 90-65, health care providers may recommend pelvic exams and Pap testing every 3 years, or they may recommend the Pap and pelvic exam, combined with testing for human papilloma virus (HPV), every 5 years. Some types of HPV increase your risk of cervical cancer. Testing for HPV may also be done on women of any age with unclear Pap test results.  Other health care providers may not recommend any screening for nonpregnant women who are considered low risk for pelvic cancer and who do not have symptoms. Ask your health care provider if a screening pelvic exam is right for you.  If you have had past treatment for cervical cancer or a condition that could lead to cancer, you need Pap tests and screening for cancer for at least 20 years after your treatment. If Pap tests have been discontinued, your risk factors (such as having a new sexual partner) need to be reassessed to determine if  screening should resume. Some women have medical problems that increase the chance of getting cervical cancer. In these cases, your health care provider may recommend more frequent screening and Pap tests. Colorectal Cancer  This type of cancer can be detected and often prevented.  Routine colorectal cancer screening usually begins at 36 years of age and continues through 37 years of age.  Your health care provider may recommend screening at an earlier age if you have risk factors for colon cancer.  Your health care provider may also recommend using home test kits to check for hidden blood in the stool.  A small camera at the end of a tube can be used to examine your colon directly (sigmoidoscopy or colonoscopy). This is done to check for the earliest forms of colorectal cancer.  Routine screening usually begins at age 51.  Direct examination of the colon should be repeated every 5-10 years through 37 years of age. However, you may need to be screened more often if early forms of precancerous polyps or small growths are found. Skin Cancer  Check your skin from  head to toe regularly.  Tell your health care provider about any new moles or changes in moles, especially if there is a change in a mole's shape or color.  Also tell your health care provider if you have a mole that is larger than the size of a pencil eraser.  Always use sunscreen. Apply sunscreen liberally and repeatedly throughout the day.  Protect yourself by wearing long sleeves, pants, a wide-brimmed hat, and sunglasses whenever you are outside. Heart disease, diabetes, and high blood pressure  High blood pressure causes heart disease and increases the risk of stroke. High blood pressure is more likely to develop in:  People who have blood pressure in the high end of the normal range (130-139/85-89 mm Hg).  People who are overweight or obese.  People who are African American.  If you are 72-62 years of age, have your  blood pressure checked every 3-5 years. If you are 58 years of age or older, have your blood pressure checked every year. You should have your blood pressure measured twice-once when you are at a hospital or clinic, and once when you are not at a hospital or clinic. Record the average of the two measurements. To check your blood pressure when you are not at a hospital or clinic, you can use:  An automated blood pressure machine at a pharmacy.  A home blood pressure monitor.  If you are between 60 years and 47 years old, ask your health care provider if you should take aspirin to prevent strokes.  Have regular diabetes screenings. This involves taking a blood sample to check your fasting blood sugar level.  If you are at a normal weight and have a low risk for diabetes, have this test once every three years after 37 years of age.  If you are overweight and have a high risk for diabetes, consider being tested at a younger age or more often. Preventing infection Hepatitis B  If you have a higher risk for hepatitis B, you should be screened for this virus. You are considered at high risk for hepatitis B if:  You were born in a country where hepatitis B is common. Ask your health care provider which countries are considered high risk.  Your parents were born in a high-risk country, and you have not been immunized against hepatitis B (hepatitis B vaccine).  You have HIV or AIDS.  You use needles to inject street drugs.  You live with someone who has hepatitis B.  You have had sex with someone who has hepatitis B.  You get hemodialysis treatment.  You take certain medicines for conditions, including cancer, organ transplantation, and autoimmune conditions. Hepatitis C  Blood testing is recommended for:  Everyone born from [birth year] through 1966.  Anyone with known risk factors for hepatitis C. Sexually transmitted infections (STIs)  You should be screened for sexually transmitted  infections (STIs) including gonorrhea and chlamydia if:  You are sexually active and are younger than 37 years of age.  You are older than 37 years of age and your health care provider tells you that you are at risk for this type of infection.  Your sexual activity has changed since you were last screened and you are at an increased risk for chlamydia or gonorrhea. Ask your health care provider if you are at risk.  If you do not have HIV, but are at risk, it may be recommended that you take a prescription medicine daily to prevent HIV infection. This  is called pre-exposure prophylaxis (PrEP). You are considered at risk if:  You are sexually active and do not regularly use condoms or know the HIV status of your partner(s).  You take drugs by injection.  You are sexually active with a partner who has HIV. Talk with your health care provider about whether you are at high risk of being infected with HIV. If you choose to begin PrEP, you should first be tested for HIV. You should then be tested every 3 months for as long as you are taking PrEP. Pregnancy  If you are premenopausal and you may become pregnant, ask your health care provider about preconception counseling.  If you may become pregnant, take 400 to 800 micrograms (mcg) of folic acid every day.  If you want to prevent pregnancy, talk to your health care provider about birth control (contraception). Osteoporosis and menopause  Osteoporosis is a disease in which the bones lose minerals and strength with aging. This can result in serious bone fractures. Your risk for osteoporosis can be identified using a bone density scan.  If you are 14 years of age or older, or if you are at risk for osteoporosis and fractures, ask your health care provider if you should be screened.  Ask your health care provider whether you should take a calcium or vitamin D supplement to lower your risk for osteoporosis.  Menopause may have certain physical  symptoms and risks.  Hormone replacement therapy may reduce some of these symptoms and risks. Talk to your health care provider about whether hormone replacement therapy is right for you. Follow these instructions at home:  Schedule regular health, dental, and eye exams.  Stay current with your immunizations.  Do not use any tobacco products including cigarettes, chewing tobacco, or electronic cigarettes.  If you are pregnant, do not drink alcohol.  If you are breastfeeding, limit how much and how often you drink alcohol.  Limit alcohol intake to no more than 1 drink per day for nonpregnant women. One drink equals 12 ounces of beer, 5 ounces of wine, or 1 ounces of hard liquor.  Do not use street drugs.  Do not share needles.  Ask your health care provider for help if you need support or information about quitting drugs.  Tell your health care provider if you often feel depressed.  Tell your health care provider if you have ever been abused or do not feel safe at home. This information is not intended to replace advice given to you by your health care provider. Make sure you discuss any questions you have with your health care provider. Document Released: 08/24/2010 Document Revised: 07/17/2015 Document Reviewed: 11/12/2014 Elsevier Interactive Patient Education  2017 [employer].

## 2016-07-16 ENCOUNTER — Other Ambulatory Visit (INDEPENDENT_AMBULATORY_CARE_PROVIDER_SITE_OTHER): Payer: 59

## 2016-07-16 ENCOUNTER — Encounter: Payer: Self-pay | Admitting: Internal Medicine

## 2016-07-16 ENCOUNTER — Ambulatory Visit (INDEPENDENT_AMBULATORY_CARE_PROVIDER_SITE_OTHER): Payer: 59 | Admitting: Internal Medicine

## 2016-07-16 VITALS — BP 108/76 | HR 98 | Temp 98.6°F | Resp 16 | Ht 61.0 in | Wt 177.0 lb

## 2016-07-16 DIAGNOSIS — Z0000 Encounter for general adult medical examination without abnormal findings: Secondary | ICD-10-CM | POA: Diagnosis not present

## 2016-07-16 DIAGNOSIS — K219 Gastro-esophageal reflux disease without esophagitis: Secondary | ICD-10-CM

## 2016-07-16 DIAGNOSIS — Z6833 Body mass index (BMI) 33.0-33.9, adult: Secondary | ICD-10-CM

## 2016-07-16 DIAGNOSIS — R5382 Chronic fatigue, unspecified: Secondary | ICD-10-CM

## 2016-07-16 DIAGNOSIS — M2550 Pain in unspecified joint: Secondary | ICD-10-CM

## 2016-07-16 DIAGNOSIS — D509 Iron deficiency anemia, unspecified: Secondary | ICD-10-CM | POA: Diagnosis not present

## 2016-07-16 DIAGNOSIS — F3289 Other specified depressive episodes: Secondary | ICD-10-CM | POA: Diagnosis not present

## 2016-07-16 DIAGNOSIS — E6609 Other obesity due to excess calories: Secondary | ICD-10-CM | POA: Diagnosis not present

## 2016-07-16 LAB — LIPID PANEL
CHOLESTEROL: 177 mg/dL (ref 0–200)
HDL: 37.5 mg/dL — ABNORMAL LOW (ref >39.00)
LDL CALC: 107 mg/dL — AB (ref 0–99)
NonHDL: 139.74
TRIGLYCERIDES: 164 mg/dL — AB (ref 0.0–149.0)
Total CHOL/HDL Ratio: 5
VLDL: 32.8 mg/dL (ref 0.0–40.0)

## 2016-07-16 LAB — COMPREHENSIVE METABOLIC PANEL
ALT: 18 U/L (ref 0–35)
AST: 17 U/L (ref 0–37)
Albumin: 4.7 g/dL (ref 3.5–5.2)
Alkaline Phosphatase: 74 U/L (ref 39–117)
BUN: 12 mg/dL (ref 6–23)
CALCIUM: 9.8 mg/dL (ref 8.4–10.5)
CHLORIDE: 101 meq/L (ref 96–112)
CO2: 28 meq/L (ref 19–32)
Creatinine, Ser: 0.76 mg/dL (ref 0.40–1.20)
GFR: 110.04 mL/min (ref >60.00)
Glucose, Bld: 87 mg/dL (ref 70–99)
Potassium: 4.2 mEq/L (ref 3.5–5.1)
Sodium: 136 mEq/L (ref 135–145)
Total Bilirubin: 0.2 mg/dL (ref 0.2–1.2)
Total Protein: 8.6 g/dL — ABNORMAL HIGH (ref 6.0–8.3)

## 2016-07-16 LAB — CBC WITH DIFFERENTIAL/PLATELET
BASOS PCT: 0.9 % (ref 0.0–3.0)
Basophils Absolute: 0.1 10*3/uL (ref 0.0–0.1)
EOS PCT: 0.5 % (ref 0.0–5.0)
Eosinophils Absolute: 0 10*3/uL (ref 0.0–0.7)
HCT: 40 % (ref 36.0–46.0)
Hemoglobin: 13.6 g/dL (ref 12.0–15.0)
LYMPHS ABS: 2.2 10*3/uL (ref 0.7–4.0)
Lymphocytes Relative: 27 % (ref 12.0–46.0)
MCHC: 34.1 g/dL (ref 30.0–36.0)
MCV: 92 fl (ref 78.0–100.0)
MONOS PCT: 9.2 % (ref 3.0–12.0)
Monocytes Absolute: 0.7 10*3/uL (ref 0.1–1.0)
NEUTROS ABS: 5 10*3/uL (ref 1.4–7.7)
NEUTROS PCT: 62.4 % (ref 43.0–77.0)
Platelets: 445 10*3/uL — ABNORMAL HIGH (ref 150.0–400.0)
RBC: 4.34 Mil/uL (ref 3.87–5.11)
RDW: 13.2 % (ref 11.5–15.5)
WBC: 8.1 10*3/uL (ref 4.0–10.5)

## 2016-07-16 LAB — TSH: TSH: 0.69 u[IU]/mL (ref 0.35–4.50)

## 2016-07-16 MED ORDER — VITAMIN D-3 125 MCG (5000 UT) PO TABS
ORAL_TABLET | ORAL | Status: AC
Start: 2016-07-16 — End: ?

## 2016-07-16 MED ORDER — DOXYCYCLINE HYCLATE 100 MG PO TABS: 100.0000 mg | ORAL_TABLET | Freq: Two times a day (BID) | ORAL | 0 refills | Status: DC

## 2016-07-16 NOTE — Assessment & Plan Note (Signed)
Encouraged regular exercise 

## 2016-07-16 NOTE — Assessment & Plan Note (Signed)
Has occ GERD - once a month Take prilosec prn

## 2016-07-16 NOTE — Assessment & Plan Note (Signed)
Started approximately 2015 Taking her vitamins Other blood work normal ? Sleep apnea-we will refer to pulmonary for further evaluation We will check for Lyme disease-she denies any known tick bite, but does have intermittent joint pain Her fatigue is likely contributing-following with psychiatry, but still poorly controlled

## 2016-07-16 NOTE — Assessment & Plan Note (Signed)
Following with psychiatry Taking Fetzima now Depression not controlled Has follow up with psychiatry

## 2016-07-16 NOTE — Assessment & Plan Note (Signed)
Taking iron religiously-recent iron levels have been within normal range Continue iron daily

## 2016-07-16 NOTE — Assessment & Plan Note (Signed)
Intermittent without joint swelling Will check ANA and Lyme disease

## 2016-07-20 LAB — ANA: Anti Nuclear Antibody(ANA): NEGATIVE

## 2016-07-20 LAB — LYME AB/WESTERN BLOT REFLEX: B burgdorferi Ab IgG+IgM: <0.9 Index (ref <0.90)

## 2016-09-09 ENCOUNTER — Emergency Department (HOSPITAL_COMMUNITY)
Admission: EM | Admit: 2016-09-09 | Discharge: 2016-09-09 | Disposition: A | Discharge status: Home / Self Care | Payer: 59 | Attending: Emergency Medicine | Admitting: Emergency Medicine

## 2016-09-09 ENCOUNTER — Encounter (HOSPITAL_COMMUNITY): Payer: Self-pay

## 2016-09-09 DIAGNOSIS — M7918 Myalgia, other site: Secondary | ICD-10-CM

## 2016-09-09 DIAGNOSIS — M791 Myalgia: Secondary | ICD-10-CM | POA: Diagnosis not present

## 2016-09-09 DIAGNOSIS — M542 Cervicalgia: Secondary | ICD-10-CM | POA: Diagnosis present

## 2016-09-09 DIAGNOSIS — Z79899 Other long term (current) drug therapy: Secondary | ICD-10-CM | POA: Diagnosis not present

## 2016-09-09 MED ORDER — CYCLOBENZAPRINE HCL 10 MG PO TABS: 10.0000 mg | ORAL_TABLET | Freq: Every evening | ORAL | 0 refills | Status: DC | PRN

## 2016-09-09 MED ORDER — ACETAMINOPHEN 500 MG PO TABS
1000.0000 mg | ORAL_TABLET | Freq: Once | ORAL | Status: AC
  Administered 2016-09-09: 1000 mg via ORAL
  Filled 2016-09-09: qty 2
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
## 2016-09-09 NOTE — Discharge Instructions (Signed)
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
## 2016-09-23 ENCOUNTER — Encounter: Payer: Self-pay | Admitting: Family Medicine

## 2016-09-23 ENCOUNTER — Ambulatory Visit (INDEPENDENT_AMBULATORY_CARE_PROVIDER_SITE_OTHER): Payer: 59 | Admitting: Family Medicine

## 2016-09-23 VITALS — BP 100/68 | HR 77 | Temp 98.6°F | Ht 60.0 in | Wt 170.0 lb

## 2016-09-23 DIAGNOSIS — R52 Pain, unspecified: Secondary | ICD-10-CM

## 2016-09-23 DIAGNOSIS — M542 Cervicalgia: Secondary | ICD-10-CM | POA: Diagnosis not present

## 2016-09-23 DIAGNOSIS — S060X0A Concussion without loss of consciousness, initial encounter: Secondary | ICD-10-CM

## 2016-09-23 MED ORDER — CYCLOBENZAPRINE HCL 10 MG PO TABS: 10.0000 mg | ORAL_TABLET | Freq: Three times a day (TID) | ORAL | 1 refills | Status: DC | PRN

## 2016-09-23 MED ORDER — MELOXICAM 15 MG PO TABS: 15.0000 mg | ORAL_TABLET | Freq: Every day | ORAL | 0 refills | Status: DC | PRN

## 2016-09-23 NOTE — Assessment & Plan Note (Addendum)
Significant symptom scoring on the self-reported review of symptoms. She does not feel herself whatsoever. She also suffers from depression and ADHD. She is medicated for these. Her symptom score sheet was scanned into the chart today. - Advised to follow-up in 2 weeks. - If no improvement and consider referral to physical therapy for vestibular ocular rehabilitation. - Upon follow-up would repeat symptoms score sheet - May need to be seen in the concussion clinic
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
## 2016-09-26 ENCOUNTER — Other Ambulatory Visit: Payer: Self-pay | Admitting: Internal Medicine

## 2016-09-26 DIAGNOSIS — R591 Generalized enlarged lymph nodes: Secondary | ICD-10-CM

## 2016-10-07 ENCOUNTER — Encounter: Payer: Self-pay | Admitting: Family Medicine

## 2016-10-07 ENCOUNTER — Ambulatory Visit (INDEPENDENT_AMBULATORY_CARE_PROVIDER_SITE_OTHER): Payer: 59 | Admitting: Family Medicine

## 2016-10-07 VITALS — BP 122/82 | HR 96 | Temp 98.8°F | Wt 170.0 lb

## 2016-10-07 DIAGNOSIS — R52 Pain, unspecified: Secondary | ICD-10-CM | POA: Diagnosis not present

## 2016-10-07 DIAGNOSIS — M542 Cervicalgia: Secondary | ICD-10-CM | POA: Diagnosis not present

## 2016-10-07 DIAGNOSIS — S060X0D Concussion without loss of consciousness, subsequent encounter: Secondary | ICD-10-CM

## 2016-10-07 DIAGNOSIS — F3289 Other specified depressive episodes: Secondary | ICD-10-CM

## 2016-10-07 DIAGNOSIS — F988 Other specified behavioral and emotional disorders with onset usually occurring in childhood and adolescence: Secondary | ICD-10-CM | POA: Diagnosis not present

## 2016-10-07 MED ORDER — ONDANSETRON HCL 4 MG PO TABS: 4.0000 mg | ORAL_TABLET | Freq: Three times a day (TID) | ORAL | 0 refills | Status: DC | PRN

## 2016-10-07 NOTE — Assessment & Plan Note (Addendum)
She will continue with physical therapy for this. Seems to still be related to muscle spasm. She has Flexeril and mobic to take. -Having some nausea associated with movement and physical therapy. We'll provide Zofran for this  - Can consider trigger point injections going forward. Can always consider an MRI in the future.
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
## 2016-10-07 NOTE — Patient Instructions (Addendum)
Thank you for coming in,    I provided Zofran for the nausea. I am referring you to physical therapy for the rehabilitation for the dizziness. I am also referring to neurology. He can follow-up with me in 2-3 weeks or if neurology considers another strategy.   Please feel free to call with any questions or concerns at any time, at [phone]. --Dr. Raeford Razor
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
## 2016-10-12 ENCOUNTER — Encounter: Payer: Self-pay | Admitting: Neurology

## 2016-10-27 ENCOUNTER — Encounter: Payer: Self-pay | Admitting: Internal Medicine

## 2016-10-27 ENCOUNTER — Ambulatory Visit (INDEPENDENT_AMBULATORY_CARE_PROVIDER_SITE_OTHER): Payer: 59 | Admitting: Internal Medicine

## 2016-10-27 VITALS — BP 118/84 | HR 100 | Temp 98.1°F | Ht 60.0 in | Wt 167.0 lb

## 2016-10-27 DIAGNOSIS — R42 Dizziness and giddiness: Secondary | ICD-10-CM | POA: Diagnosis not present

## 2016-10-27 DIAGNOSIS — J309 Allergic rhinitis, unspecified: Secondary | ICD-10-CM | POA: Diagnosis not present

## 2016-10-27 MED ORDER — MECLIZINE HCL 12.5 MG PO TABS: 12.5000 mg | ORAL_TABLET | Freq: Three times a day (TID) | ORAL | 1 refills | Status: AC | PRN

## 2016-10-27 MED ORDER — PREDNISONE 10 MG PO TABS: ORAL_TABLET | ORAL | 0 refills | Status: DC

## 2016-10-27 MED ORDER — METHYLPREDNISOLONE ACETATE 80 MG/ML IJ SUSP
80.0000 mg | Freq: Once | INTRAMUSCULAR | Status: AC
  Administered 2016-10-27: 80 mg via INTRAMUSCULAR

## 2016-10-27 MED ORDER — TRIAMCINOLONE ACETONIDE 55 MCG/ACT NA AERO: 2.0000 | INHALATION_SPRAY | Freq: Every day | NASAL | 12 refills | Status: DC

## 2016-10-27 MED ORDER — CETIRIZINE HCL 10 MG PO TABS: 10.0000 mg | ORAL_TABLET | Freq: Every day | ORAL | 11 refills | Status: DC

## 2016-10-27 NOTE — Assessment & Plan Note (Addendum)
Mild to mod, for depomedrol IM80, predpac asd,  For zyrtec and nasacort asd, to f/u any worsening symptoms or concerns

## 2016-10-27 NOTE — Assessment & Plan Note (Signed)
Most likely peripheral in nature, for meclizine prn,  to f/u any worsening symptoms or concerns

## 2016-10-27 NOTE — Patient Instructions (Signed)
You had the steroid shot today  Please take all new medication as prescribed - the prednisone, zyrtec, nasacort and meclizine for dizziness  You can also take Mucinex (or it's generic off brand) for congestion including the left ear, and tylenol as needed for pain.  Please continue all other medications as before, and refills have been done if requested.  Please have the pharmacy call with any other refills you may need.  Please keep your appointments with your specialists as you may have planned

## 2016-10-27 NOTE — Progress Notes (Signed)
Subjective:    Patient ID: Stephanie Werner, female    DOB: December 19, 1980, 36 y.o.   MRN: [id number]  HPI  Here to f/u with c/o positional vertigo with dizziness and nausea especially to look downwards for 1 wk.  No vomiting, fever or worsening overall pain but Does have several wks ongoing nasal allergy symptoms with clearish congestion, itch and sneezing, without fever, pain, ST, cough, swelling or wheezing.   Past Medical History:  Diagnosis Date  . Anxiety   . Depression    No past surgical history on file.  reports that she has never smoked. She has never used smokeless tobacco. She reports that she does not drink alcohol or use drugs. family history includes Cancer in her brother; Diabetes in her brother and mother; Heart disease in her mother; Hyperlipidemia in her mother; Hypertension in her mother; Kidney disease in her mother. Allergies  Allergen Reactions  . Penicillins Rash    Has patient had a PCN reaction causing immediate rash, facial/tongue/throat swelling, SOB or lightheadedness with hypotension: No Has patient had a PCN reaction causing severe rash involving mucus membranes or skin necrosis: } Has patient had a PCN reaction that required hospitalization: no Has patient had a PCN reaction occurring within the last 10 years: no If all of the above answers are "NO", then may proceed with Cephalosporin use.    Current Outpatient Prescriptions on File Prior to Visit  Medication Sig Dispense Refill  . amphetamine-dextroamphetamine (ADDERALL) 20 MG tablet Take 20 mg by mouth 2 (two) times daily.  0  . B Complex Vitamins (VITAMIN B COMPLEX PO) Take by mouth every morning.    . Brexpiprazole (REXULTI) 0.5 MG TABS Take 0.5-1 mg by mouth daily.    Marland Kitchen buPROPion (WELLBUTRIN XL) 150 MG 24 hr tablet Take 300 mg by mouth daily.   12  . Cholecalciferol (VITAMIN D-3) 5000 units TABS Taking daily 30 tablet   . cyclobenzaprine (FLEXERIL) 10 MG tablet Take 1 tablet (10 mg total) by mouth 3  (three) times daily as needed for muscle spasms. 30 tablet 1  . doxycycline (VIBRA-TABS) 100 MG tablet Take 1 tablet (100 mg total) by mouth 2 (two) times daily. 20 tablet 0  . Fe Cbn-Fe Gluc-FA-B12-C-DSS (FERRALET 90) 90-1 MG TABS Take 1 tablet by mouth daily.  3  . fexofenadine (ALLEGRA) 180 MG tablet Take 1 tablet (180 mg total) by mouth daily. (Patient taking differently: Take 180 mg by mouth daily as needed for allergies. ) 30 tablet 0  . fluticasone (FLONASE) 50 MCG/ACT nasal spray INSTILL 1 SPRAY IN EACH NOSTIL TWICE A DAY 16 g 1  . guaiFENesin (MUCINEX) 600 MG 12 hr tablet Take 1 tablet (600 mg total) by mouth 2 (two) times daily as needed for cough or to loosen phlegm. 14 tablet 0  . meloxicam (MOBIC) 15 MG tablet Take 1 tablet (15 mg total) by mouth daily as needed for pain. 30 tablet 0  . Omega-3 Fatty Acids (FISH OIL) 1200 MG CAPS Taking daily    . omeprazole (PRILOSEC) 20 MG capsule Take 1 capsule (20 mg total) by mouth as needed. 90 capsule 3  . ondansetron (ZOFRAN) 4 MG tablet Take 1 tablet (4 mg total) by mouth every 8 (eight) hours as needed for nausea or vomiting. 20 tablet 0  . oxymetazoline (AFRIN NASAL SPRAY) 0.05 % nasal spray Place 1 spray into both nostrils 2 (two) times daily. Use only for 3days, then stop 30 mL 0  .  sodium chloride (OCEAN) 0.65 % SOLN nasal spray Place 1 spray into both nostrils as needed for congestion. 15 mL 0  . venlafaxine XR (EFFEXOR-XR) 150 MG 24 hr capsule Take 150 mg by mouth daily.    . Zinc-Magnesium Aspart-Vit B6 (ZINC MAGNESIUM ASPARTATE PO) Take by mouth every morning.     No current facility-administered medications on file prior to visit.    Review of Systems All other system neg per pt    Objective:   Physical Exam BP 118/84   Pulse 100   Temp 98.1 F (36.7 C) (Oral)   Ht 5' (1.524 m)   Wt 167 lb (75.8 kg)   SpO2 100%   BMI 32.61 kg/m  VS noted, non toxic Constitutional: Pt appears in NAD HENT: Head: NCAT.  Right Ear:  External ear normal.  Left Ear: External ear normal.  Bilat tm's with mild erythema but left mild worse and has serous effusion on left.  Max sinus areas non tender.  Pharynx with mild erythema, no exudate Eyes: . Pupils are equal, round, and reactive to light. Conjunctivae and EOM are normal Nose: without d/c or deformity Neck: Neck supple. Gross normal ROM Cardiovascular: Normal rate and regular rhythm.   Pulmonary/Chest: Effort normal and breath sounds without rales or wheezing.  Abd:  Soft, NT, ND, + BS, no organomegaly Neurological: Pt is alert. At baseline orientation, motor grossly intact Skin: Skin is warm. No rashes, other new lesions, no LE edema Psychiatric: Pt behavior is normal without agitation  No other exam findings       Assessment & Plan:

## 2016-11-09 ENCOUNTER — Ambulatory Visit (INDEPENDENT_AMBULATORY_CARE_PROVIDER_SITE_OTHER): Payer: 59 | Admitting: Internal Medicine

## 2016-11-09 ENCOUNTER — Encounter: Payer: Self-pay | Admitting: Internal Medicine

## 2016-11-09 VITALS — BP 114/80 | HR 83 | Ht 61.0 in | Wt 167.2 lb

## 2016-11-09 DIAGNOSIS — G4733 Obstructive sleep apnea (adult) (pediatric): Secondary | ICD-10-CM | POA: Diagnosis not present

## 2016-11-09 DIAGNOSIS — R5382 Chronic fatigue, unspecified: Secondary | ICD-10-CM | POA: Diagnosis not present

## 2016-11-09 DIAGNOSIS — F32A Depression, unspecified: Secondary | ICD-10-CM

## 2016-11-09 DIAGNOSIS — F329 Major depressive disorder, single episode, unspecified: Secondary | ICD-10-CM | POA: Diagnosis not present

## 2016-11-09 NOTE — Patient Instructions (Signed)
Order- unattended home sleep test     Dx [facility]  Please call me about 2 weeks after  Your sleep test to ask for report and recommendations.
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
## 2016-11-09 NOTE — Assessment & Plan Note (Signed)
History and physical exam are consistent with obstructive sleep apnea. We don't have a current witness but no parasomnias are described. We discussed sleep, sleep assessment process and some interventions that might be appropriate if she had significant sleep apnea. Plan-we are scheduling sleep study and she will call for results.

## 2016-11-09 NOTE — Assessment & Plan Note (Signed)
Her affect today suggests depression, managed by her primary physician.

## 2016-11-12 ENCOUNTER — Ambulatory Visit: Payer: 59 | Attending: Family Medicine | Admitting: Rehabilitative and Restorative Service Providers"

## 2016-11-12 DIAGNOSIS — R2689 Other abnormalities of gait and mobility: Secondary | ICD-10-CM | POA: Diagnosis present

## 2016-11-12 DIAGNOSIS — R42 Dizziness and giddiness: Secondary | ICD-10-CM

## 2016-11-12 DIAGNOSIS — R2681 Unsteadiness on feet: Secondary | ICD-10-CM | POA: Diagnosis present

## 2016-11-12 NOTE — Patient Instructions (Signed)
Gaze Stabilization: Sitting    Keeping eyes on target on wall 3 feet away or a target held in your hand, and move head side to side for 10 times. Repeat while moving head up and down for  10 times. Do __2-3__ sessions per day.  Copyright  VHI. All rights reserved.   Gaze Stabilization: Tip Card  1.Target must remain in focus, not blurry, and appear stationary while head is in motion. 2.Perform exercises with small head movements (45 to either side of midline). 3.Increase speed of head motion so long as target is in focus. 4.If you wear eyeglasses, be sure you can see target through lens (therapist will give specific instructions for bifocal / progressive lenses). 5.These exercises may provoke dizziness or nausea. Work through these symptoms. If too dizzy, slow head movement slightly. Rest between each exercise. 6.Exercises demand concentration; avoid distractions.  Copyright  VHI. All rights reserved.    WALKING  Walking is a great form of exercise to increase your strength, endurance and overall fitness.  A walking program can help you start slowly and gradually build endurance as you go.  Everyone's ability is different, so each person's starting point will be different.  You do not have to follow them exactly.  The are just samples. You should simply find out what's right for you and stick to that program.   In the beginning, you'll start off walking 2-3 times a day for short distances.  As you get stronger, you'll be walking further at just 1-2 times per day.  A. You Can Walk For A Certain Length Of Time Each Day    Walk 10 minutes 2 times per day.  Increase 2 minutes every 3-4 days.  Work up to 25-30 minutes (1-2 times per day).
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
## 2016-11-15 ENCOUNTER — Encounter: Payer: Self-pay | Admitting: Rehabilitative and Restorative Service Providers"

## 2016-11-18 DIAGNOSIS — R0683 Snoring: Secondary | ICD-10-CM | POA: Diagnosis not present

## 2016-11-19 ENCOUNTER — Other Ambulatory Visit: Payer: Self-pay | Admitting: *Deleted

## 2016-11-19 DIAGNOSIS — G4733 Obstructive sleep apnea (adult) (pediatric): Secondary | ICD-10-CM

## 2016-11-19 DIAGNOSIS — R0683 Snoring: Secondary | ICD-10-CM | POA: Diagnosis not present

## 2016-11-24 ENCOUNTER — Ambulatory Visit: Payer: 59 | Attending: Family Medicine | Admitting: Rehabilitative and Restorative Service Providers"

## 2016-11-24 DIAGNOSIS — R2689 Other abnormalities of gait and mobility: Secondary | ICD-10-CM | POA: Diagnosis present

## 2016-11-24 DIAGNOSIS — R42 Dizziness and giddiness: Secondary | ICD-10-CM | POA: Insufficient documentation

## 2016-11-24 DIAGNOSIS — R2681 Unsteadiness on feet: Secondary | ICD-10-CM | POA: Diagnosis present

## 2016-11-24 NOTE — Patient Instructions (Addendum)
Gaze Stabilization - Tip Card  1.Target must remain in focus, not blurry, and appear stationary while head is in motion. 2.Perform exercises with small head movements (45 to either side of midline). 3.Increase speed of head motion so long as target is in focus. 4.If you wear eyeglasses, be sure you can see target through lens (therapist will give specific instructions for bifocal / progressive lenses). 5.These exercises may provoke dizziness or nausea. Work through these symptoms. If too dizzy, slow head movement slightly. Rest between each exercise. 6.Exercises demand concentration; avoid distractions. 7.For safety, perform standing exercises close to a counter, wall, corner, or next to someone.  Copyright  VHI. All rights reserved.   Gaze Stabilization - Standing Feet Apart   Feet shoulder width apart, keeping eyes on target on wall 3 feet away, tilt head down slightly and move head side to side for 30 seconds. Repeat while moving head up and down for 30 seconds. *Work up to tolerating 60 seconds, as able. Do 2-3 sessions per day.   Copyright  VHI. All rights reserved.    Turning in Place: Solid Surface    Standing in place, lead with head and turn 1/2 turn in place.  *You can spot something after you turn and allow symptoms to settle. Repeat _5___ times per session. Do __2-3__ sessions per day.  Copyright  VHI. All rights reserved.   Bending / Picking Up Objects    Sitting, slowly bend head down and pick up object on the floor. Return to upright position. Hold position until symptoms subside. Repeat _5___ times per session. Do __2-3__ sessions per day.  Copyright  VHI. All rights reserved.     Eye Tracking: Convergence    Use a letter held in your hand at arm's length.  Move the letter closer to your nose while continuing to look at the letter.  Bring the letter to 4" from nose.  Then move the letter away from your  Nose while continuing to track the letter.  Repeat 5 times. 2-3 times/day.  Copyright  VHI. All rights reserved.    WALKING  Walking is a great form of exercise to increase your strength, endurance and overall fitness.  A walking program can help you start slowly and gradually build endurance as you go.  Everyone's ability is different, so each person's starting point will be different.  You do not have to follow them exactly.  The are just samples. You should simply find out what's right for you and stick to that program.   In the beginning, you'll start off walking 2-3 times a day for short distances.  As you get stronger, you'll be walking further at just 1-2 times per day.  A.         You Can Walk For A Certain Length Of Time Each Day                          Walk 10 minutes 2 times per day.             Increase 2 minutes every 3-4 days.             Work up to 25-30 minutes (1-2 times per day).

## 2016-11-25 NOTE — Therapy (Signed)
[facility] [address] [place], [place], [address] Phone: [phone]   Fax:  [phone]  Physical Therapy Treatment  Patient Details  Name: Stephanie Werner MRN: [id number] Date of Birth: March 18, 1980 Referring Provider: Clearance Coots, MD  Encounter Date: 11/24/2016      PT End of Session - 11/24/16 1303    Visit Number 2   Number of Visits 8   Date for PT Re-Evaluation 01/11/17   Authorization Type [employer]- 60 visit limit *PT to discuss with patient as she is receiving services at a private clinic--will want to make her aware of limit   PT Start Time 1240   PT Stop Time 1320   PT Time Calculation (min) 40 min   Activity Tolerance Patient tolerated treatment well   Behavior During Therapy --  Patient emotionally labile      Past Medical History:  Diagnosis Date  . Anxiety   . Depression     No past surgical history on file.  There were no vitals filed for this visit.      Subjective Assessment - 11/24/16 1243    Subjective The patient reports she takes meclizine when experiencing nausea and dizziness together.  She is doing HEP and notices increased nausea with head motion.  Walking program is going okay. "I dont' feel like I'm getting the best use of my time with integrative therapy" and inquired about her moving her therpay to our clinic.  PT discussed we don't want to duplicate services and recommended she speak with MD.   Patient Stated Goals Patient looking at return to work and notes concern managing computer -- estimated date of return modified to 01/21/17   Currently in Pain? Yes   Pain Score 6    Pain Location Head   Pain Descriptors / Indicators Headache   Pain Onset More than a month ago   Pain Frequency Constant   Aggravating Factors  varies in intensity; worse with visual stimulation   Pain Relieving Factors patient feels vinegar and pickle juice help with nausea            [facility] PT  Assessment - 11/24/16 1308      Ambulation/Gait   Ambulation/Gait Yes   Gait velocity 2.95 ft/sec   Gait Comments Gait activities with head motion every 4 steps in vertical plane, then in horizontal plane with supervision.  Turn and stop.  Walking in gym x 200 ft provokes a sensation like "I would go lay down now if I were home".                     [facility] Adult PT Treatment/Exercise - 11/24/16 1308      Ambulation/Gait   Ambulation Distance (Feet) 200 Feet   Assistive device None   Ambulation Surface Level;Indoor     Neuro Re-ed    Neuro Re-ed Details  Corner balance exercises performing standing head motion without loss of balance.         Vestibular Treatment/Exercise - 11/24/16 1250      Vestibular Treatment/Exercise   Vestibular Treatment Provided Habituation;Gaze   Habituation Exercises 180 degree Turns;Standing Horizontal Head Turns;Seated Diagonal Head Turns   Gaze Exercises Comment;Eye/Head Exercise Horizontal     Seated Diagonal Head Turns   Symptoms Description  BENDING (not true diagonal) head motion from seated position to reach to floor<>return to sit x 5 reps for motion sensitivity program.     Standing Horizontal Head Turns  Number of Reps  5   Symptom Description  In corner without loss of balance.     180 degree Turns   Number of Reps  5   Symptom Description  quarter and half turns with visual spotting added to HEP for motion sensitivity.     X1 Viewing Horizontal   Foot Position seated, then progressed to standing   Reps 2   Comments 2 x seated and then transitioned to standing x 30 seconds.  Patient notes a pulling sensation in the left side of her neck     X1 Viewing Vertical   Foot Position 2/10 at beginning; standing   Comments increases to 4/10 x 30 seconds vertical plane               PT Education - 11/25/16 1213    Education provided Yes   Education Details HEP: added convergence, turns, and bending for habituation    Person(s) Educated Patient   Methods Explanation;Demonstration;Handout   Comprehension Verbalized understanding;Returned demonstration          PT Short Term Goals - 11/12/16 2032      PT SHORT TERM GOAL #1   Title The patient will return demo HEP for gaze adaptation, convergence, motion sensitivity and balance, as indicated. 10/21 STG date   Time 4   Period Weeks   Target Date 12/12/16     PT SHORT TERM GOAL #2   Title The patient will tolerate gaze x 1 viewing x 30 seconds with change in dizziness < or equal to 2/10.   Time 4   Period Weeks   Target Date 12/12/16     PT SHORT TERM GOAL #3   Title The patient will report baseline dizziness < or equal to 3/10.   Time 4   Period Weeks   Target Date 12/12/16     PT SHORT TERM GOAL #4   Title The patient will tolerate walking x 20 minutes nonstop to demo improved tolerance to motion for return to work/IADL tasks.   Time 4   Period Weeks   Target Date 12/12/16     PT SHORT TERM GOAL #5   Title Further assess gait/balance and LTGs to follow as indicated.   Time 4   Period Weeks   Target Date 12/12/16           PT Long Term Goals - 11/12/16 2036      PT LONG TERM GOAL #1   Title The patient will return demo progression of HEP.   Time 8   Period Weeks   Target Date 01/11/17     PT LONG TERM GOAL #2   Title The patient will improve SVA versus DVA to < or equal to 3 line difference to demo improved use of VOR.   Time 8   Period Weeks   Target Date 12/12/16     PT LONG TERM GOAL #3   Title The patient will subjectively report no dizziness at baseline.   Time 8   Period Weeks   Target Date 01/11/17     PT LONG TERM GOAL #4   Title The patient will verbalize strategies for compensation when in busy environments for return to community and work tasks.   Time 8   Period Weeks   Target Date 01/11/17     PT LONG TERM GOAL #5   Title The patient will verbalize community wellness activities for long term health  management.   Time 8  Period Weeks   Target Date 01/11/17     Additional Long Term Goals   Additional Long Term Goals Yes     PT LONG TERM GOAL #6   Title LTGs for gait/balance, if indicated   Time 8   Period Weeks   Target Date 01/11/17               Plan - 11/25/16 1216    Clinical Impression Statement The patient tolerated increased activity today.  We discussed at length reduction of lying down to allow symptoms to settle and PT recommended she sit to allow decreased symptoms.  Also discussed moving frequently t/o the day to allow system to be challenged so that it will correct motion sensitivity.  Patient continues with neck guarding (currently being addressed at integrative therapies).   Clinical Impairments Affecting Rehab Potential Barriers include sleep disturbance, recent loss of family member, anxiety/depression, headaches.   PT Treatment/Interventions ADLs/Self Care Home Management;Neuromuscular re-education;Balance training;Therapeutic exercise;Therapeutic activities;Patient/family education;Gait training;Manual techniques;Vestibular;Functional mobility training   PT Next Visit Plan check HEP; brock string; assess gait/balance   Consulted and Agree with Plan of Care Patient      Patient will benefit from skilled therapeutic intervention in order to improve the following deficits and impairments:  Abnormal gait, Decreased balance, Dizziness, Impaired vision/preception, Impaired sensation, Decreased activity tolerance, Pain  Visit Diagnosis: Dizziness and giddiness  Other abnormalities of gait and mobility  Unsteadiness on feet     Problem List Patient Active Problem List   Diagnosis Date Noted  . Allergic rhinitis 10/27/2016  . Vertigo 10/27/2016  . Body aches 09/23/2016  . Concussion with no loss of consciousness 09/23/2016  . Neck pain 09/23/2016  . IDA (iron deficiency anemia) 03/23/2016  . GERD (gastroesophageal reflux disease) 05/28/2015  . ADD  (attention deficit disorder) 05/28/2015  . Depression 05/28/2015  . Chronic fatigue 05/28/2015  . Joint pain 05/28/2015  . Family history of diabetes mellitus 05/28/2015  . Fibroid 05/28/2015  . Anemia, iron deficiency 05/28/2015  . Obese 05/28/2015  . Lymphadenopathy 06/27/2014    Rickardo Brinegar, PT 11/25/2016, 12:18 PM  [facility] [address] [place], [place], [address] Phone: [phone]   Fax:  [phone]  Name: Stephanie Werner MRN: [id number] Date of Birth: 1980-07-01

## 2016-12-03 ENCOUNTER — Ambulatory Visit: Payer: 59 | Admitting: Rehabilitative and Restorative Service Providers"

## 2016-12-08 ENCOUNTER — Encounter: Payer: Self-pay | Admitting: Rehabilitative and Restorative Service Providers"

## 2016-12-15 ENCOUNTER — Other Ambulatory Visit: Payer: Self-pay | Admitting: Internal Medicine

## 2016-12-15 DIAGNOSIS — R591 Generalized enlarged lymph nodes: Secondary | ICD-10-CM

## 2017-01-24 ENCOUNTER — Encounter: Payer: Self-pay | Admitting: Rehabilitative and Restorative Service Providers"

## 2017-01-24 NOTE — Therapy (Signed)
[facility] [address] [place], [place], [address] Phone: [phone]   Fax:  [phone]  Patient Details  Name: Stephanie Werner MRN: [id number] Date of Birth: Jul 12, 1980 Referring Provider:  Clearance Coots, MD  Encounter Date: last encounter 11/24/16  PHYSICAL THERAPY DISCHARGE SUMMARY  Visits from Start of Care: 2  Current functional level related to goals / functional outcomes: PT Short Term Goals - 11/12/16 2032      PT SHORT TERM GOAL #1   Title  The patient will return demo HEP for gaze adaptation, convergence, motion sensitivity and balance, as indicated. 10/21 STG date    Time  4    Period  Weeks    Target Date  12/12/16      PT SHORT TERM GOAL #2   Title  The patient will tolerate gaze x 1 viewing x 30 seconds with change in dizziness < or equal to 2/10.    Time  4    Period  Weeks    Target Date  12/12/16      PT SHORT TERM GOAL #3   Title  The patient will report baseline dizziness < or equal to 3/10.    Time  4    Period  Weeks    Target Date  12/12/16      PT SHORT TERM GOAL #4   Title  The patient will tolerate walking x 20 minutes nonstop to demo improved tolerance to motion for return to work/IADL tasks.    Time  4    Period  Weeks    Target Date  12/12/16      PT SHORT TERM GOAL #5   Title  Further assess gait/balance and LTGs to follow as indicated.    Time  4    Period  Weeks    Target Date  12/12/16      PT Long Term Goals - 11/12/16 2036      PT LONG TERM GOAL #1   Title  The patient will return demo progression of HEP.    Time  8    Period  Weeks    Target Date  01/11/17      PT LONG TERM GOAL #2   Title  The patient will improve SVA versus DVA to < or equal to 3 line difference to demo improved use of VOR.    Time  8    Period  Weeks    Target Date  12/12/16      PT LONG TERM GOAL #3   Title  The patient will subjectively report no dizziness at baseline.    Time  8     Period  Weeks    Target Date  01/11/17      PT LONG TERM GOAL #4   Title  The patient will verbalize strategies for compensation when in busy environments for return to community and work tasks.    Time  8    Period  Weeks    Target Date  01/11/17      PT LONG TERM GOAL #5   Title  The patient will verbalize community wellness activities for long term health management.    Time  8    Period  Weeks    Target Date  01/11/17      Additional Long Term Goals   Additional Long Term Goals  Yes      PT LONG TERM GOAL #6   Title  LTGs for gait/balance,  if indicated    Time  8    Period  Weeks    Target Date  01/11/17      The patient did not return and therefore, STG and LTGs not reassessed.  In appt notes, reason for cancellations was "patient not feeling well".   Remaining deficits: See initial evaluation for patient status.   Education / Equipment: Home program was initiated.   Plan: Patient agrees to discharge.  Patient goals were not met. Patient is being discharged due to not returning since the last visit.  ?????        Thank you for the referral of this patient. Rudell Cobb, MPT   Clare, PT 01/24/2017, 8:52 AM  [facility] [address] [place], [place], [address] Phone: [phone]   Fax:  [phone]

## 2017-01-25 ENCOUNTER — Ambulatory Visit: Payer: 59 | Admitting: Neurology

## 2017-01-25 ENCOUNTER — Encounter: Payer: Self-pay | Admitting: Neurology

## 2017-01-25 ENCOUNTER — Other Ambulatory Visit: Payer: 59

## 2017-01-25 VITALS — BP 110/80 | HR 95 | Ht 66.0 in | Wt 165.8 lb

## 2017-01-25 DIAGNOSIS — F329 Major depressive disorder, single episode, unspecified: Secondary | ICD-10-CM | POA: Diagnosis not present

## 2017-01-25 DIAGNOSIS — S060X0A Concussion without loss of consciousness, initial encounter: Secondary | ICD-10-CM

## 2017-01-25 DIAGNOSIS — G44219 Episodic tension-type headache, not intractable: Secondary | ICD-10-CM | POA: Diagnosis not present

## 2017-01-25 DIAGNOSIS — R413 Other amnesia: Secondary | ICD-10-CM

## 2017-01-25 DIAGNOSIS — F32A Depression, unspecified: Secondary | ICD-10-CM

## 2017-01-25 NOTE — Patient Instructions (Addendum)
1.  To help reduce HEADACHES, take over the counter: Coenzyme Q10 160mg  ONCE DAILY Riboflavin/Vitamin B2 400mg  ONCE DAILY Magnesium oxide 400mg  ONCE - TWICE DAILY Turmeric 500mg  twice daily  2.  We will set you up for neurocognitive testing.  If you are feeling better, than you may call to cancel.  3.  Check B12 level 4.  Follow up as needed (if you have the testing and it is abnormal)
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
## 2017-01-26 LAB — TIQ-NTM

## 2017-01-26 LAB — VITAMIN B12

## 2017-01-28 ENCOUNTER — Telehealth: Payer: Self-pay

## 2017-01-28 NOTE — Telephone Encounter (Signed)
-----   Message from Pieter Partridge, DO sent at 01/26/2017  6:58 AM EST ----- B12 level is okay

## 2017-01-28 NOTE — Telephone Encounter (Signed)
Called and spoke with Pt, advsd her of B12 results. Pt states she wants to hold off on neuropsych testing with Dr Si Raider and will call us if she feels the need to be seen. Cancelled her appts.

## 2017-02-01 ENCOUNTER — Encounter: Payer: Self-pay | Admitting: Psychology

## 2017-02-17 ENCOUNTER — Ambulatory Visit: Payer: Self-pay | Admitting: Internal Medicine

## 2017-03-22 ENCOUNTER — Encounter: Payer: Self-pay | Admitting: Psychology

## 2017-06-14 ENCOUNTER — Other Ambulatory Visit: Payer: Self-pay | Admitting: Internal Medicine

## 2017-06-14 DIAGNOSIS — R591 Generalized enlarged lymph nodes: Secondary | ICD-10-CM

## 2017-11-11 ENCOUNTER — Other Ambulatory Visit: Payer: Self-pay | Admitting: Internal Medicine

## 2018-07-11 DIAGNOSIS — M1990 Unspecified osteoarthritis, unspecified site: Secondary | ICD-10-CM | POA: Insufficient documentation

## 2019-02-26 NOTE — Progress Notes (Signed)
Virtual Visit via Video Note  I connected with Stephanie Werner on 02/27/19 at  2:00 PM EST by a video enabled telemedicine application and verified that I am speaking with the correct person using two identifiers.   I discussed the limitations of evaluation and management by telemedicine and the availability of in person appointments. The patient expressed understanding and agreed to proceed.  Present for the visit:  Myself, Dr Billey Gosling, Doran Durand.  The patient is currently at home and I am in the office.    No referring provider.    History of Present Illness: This visit for an acute visit for cold symptoms/ spitting up green mucus.   Her symptoms started two days ago.    She is experiencing heaviness and tightness in her chest. She also has labored breathing, back pain, and has been coughing up some green mucus in the morning, and congestion.  She denies fever, wheeze, chest pain, headaches.  She had PNA in the past and her symptoms feel like that.    She has also has some nasal bleeding and a sore in her left nostril.  This started over two weeks that is not healing.  The air in her house is dry and that may be contributing.  She has tried saline nasal sprays.  She has tried putting vaseline on it and it has not healed.      Review of Systems  Constitutional: Negative for chills and fever.  HENT: Positive for congestion. Negative for ear pain, sinus pain and sore throat.        Nasal sore, some dec smell  Respiratory: Positive for cough (in morning - green sputum) and shortness of breath. Negative for wheezing.        Chest tightness  Cardiovascular: Negative for chest pain.  Gastrointestinal: Negative for diarrhea and nausea.  Neurological: Negative for headaches.      Social History   Socioeconomic History  . Marital status: Single    Spouse name: Not on file  . Number of children: 0  . Years of education: Not on file  . Highest education level: Bachelor's  degree (e.g., BA, AB, BS)  Occupational History  . Occupation: unemployed  Tobacco Use  . Smoking status: Never Smoker  . Smokeless tobacco: Never Used  . Tobacco comment: never used tobacco  Substance and Sexual Activity  . Alcohol use: No    Alcohol/week: 0.0 standard drinks  . Drug use: No  . Sexual activity: Not on file  Other Topics Concern  . Not on file  Social History Narrative   Single, lives alone. Rarely drinks caffeine. No regular exercise.   Social Determinants of Health   Financial Resource Strain:   . Difficulty of Paying Living Expenses: Not on file  Food Insecurity:   . Worried About Charity fundraiser in the Last Year: Not on file  . Ran Out of Food in the Last Year: Not on file  Transportation Needs:   . Lack of Transportation (Medical): Not on file  . Lack of Transportation (Non-Medical): Not on file  Physical Activity:   . Days of Exercise per Week: Not on file  . Minutes of Exercise per Session: Not on file  Stress:   . Feeling of Stress : Not on file  Social Connections:   . Frequency of Communication with Friends and Family: Not on file  . Frequency of Social Gatherings with Friends and Family: Not on file  . Attends Religious Services:  Not on file  . Active Member of Clubs or Organizations: Not on file  . Attends Archivist Meetings: Not on file  . Marital Status: Not on file     Observations/Objective: Appears well in NAD Breathing normally No active nose bleed Skin appears warm and dry  Assessment and Plan:  See Problem List for Assessment and Plan of chronic medical problems.   Follow Up Instructions:    I discussed the assessment and treatment plan with the patient. The patient was provided an opportunity to ask questions and all were answered. The patient agreed with the plan and demonstrated an understanding of the instructions.   The patient was advised to call back or seek an in-person evaluation if the symptoms  worsen or if the condition fails to improve as anticipated.    Binnie Rail, MD

## 2019-02-27 ENCOUNTER — Encounter: Payer: Self-pay | Admitting: Internal Medicine

## 2019-02-27 ENCOUNTER — Ambulatory Visit (INDEPENDENT_AMBULATORY_CARE_PROVIDER_SITE_OTHER): Payer: Medicare Other | Admitting: Internal Medicine

## 2019-02-27 DIAGNOSIS — J209 Acute bronchitis, unspecified: Secondary | ICD-10-CM

## 2019-02-27 DIAGNOSIS — J3489 Other specified disorders of nose and nasal sinuses: Secondary | ICD-10-CM | POA: Insufficient documentation

## 2019-02-27 MED ORDER — DOXYCYCLINE HYCLATE 100 MG PO TABS: 100.0000 mg | ORAL_TABLET | Freq: Two times a day (BID) | ORAL | 0 refills | Status: DC

## 2019-02-27 MED ORDER — ALBUTEROL SULFATE HFA 108 (90 BASE) MCG/ACT IN AERS: 2.0000 | INHALATION_SPRAY | Freq: Four times a day (QID) | RESPIRATORY_TRACT | 1 refills | Status: DC | PRN

## 2019-02-27 MED ORDER — MUPIROCIN 2 % EX OINT: 1.0000 "application " | TOPICAL_OINTMENT | Freq: Two times a day (BID) | CUTANEOUS | 0 refills | Status: DC

## 2019-02-27 NOTE — Assessment & Plan Note (Signed)
2 weeks of nonhealing sore Has tried Vaseline, saline nasal spray and humidifier Trial of Bactroban ointment

## 2019-02-27 NOTE — Assessment & Plan Note (Signed)
Symptoms consistent with possible acute bronchitis-probable bacterial in nature History of pneumonia and symptoms are similar to previous pneumonia Unable to listen to lungs or do further evaluation since this is a virtual visit We will treat empirically with an antibiotic for bacterial infection-start doxycycline twice daily x10 days Continue symptomatic treatment with over-the-counter cold medications Albuterol inhaler as needed

## 2019-02-28 ENCOUNTER — Telehealth: Payer: Self-pay | Admitting: Internal Medicine

## 2019-02-28 MED ORDER — ALBUTEROL SULFATE HFA 108 (90 BASE) MCG/ACT IN AERS: 2.0000 | INHALATION_SPRAY | Freq: Four times a day (QID) | RESPIRATORY_TRACT | 1 refills | Status: DC | PRN

## 2019-02-28 NOTE — Telephone Encounter (Signed)
Pt has a cost of $50 for her albuterol (VENTOLIN HFA) 108 (90 Base) MCG/ACT inhaler  And was advised by [facility] to try Proventil /Pt asked for a lower cost inhaler to be sent to the [facility] on [address], [place]. Phone:  [phone]  Fax:  [phone]     Pt also wanted to advise Dr. Quay Burow that this morning she would cough up phlegm that had blod in it and wanted to know if that was normal with an infection/ please advise

## 2019-02-28 NOTE — Telephone Encounter (Signed)
Proventil sent

## 2019-03-20 NOTE — Assessment & Plan Note (Addendum)
Following with psychiatry, management per above

## 2019-03-20 NOTE — Progress Notes (Signed)
Subjective:    Patient ID: Stephanie Werner, female    DOB: 09-Dec-1980, 39 y.o.   MRN: [id number]  HPI She is here for follow up.  She is taking all of her medications as prescribed.     Iron deficiency anemia:  Still having monthly periods that are heavy.  She is taking iron daily.  Her heavy periods are secondary to her fibroids and she does follow with her gynecologist.  She may consider surgery at some point, but wants to avoid it during the Covid pandemic.  GERD:  She is taking her medication as needed.  She has GERD symptoms about twice a week.  The medication works well when she takes it.    Allergic rhinitis:  She takes zyrtec daily and mucinex as needed. She uses a saline nasal spray. She has year round allergies.  She feels like she battles with her allergies.  She always feels stuffy.  She uses a humidifier.    Starting in August 2020 she started having respiratory issues.  She had PNA in August.  She had discolored mucus - 8/16, 10/23, 12/10, 1/5.  She woke up on those mornings and coughed up discolored mucus.   She saw green, black and purple color mucus.  It would occur for a a few days.  She did show me pictures of some of her mucus.  She currently does not have any sputum production or discoloration of her sputum.  Earlier this month she took doxcycline and she has not seen any discolored mucus since then.      Medications and allergies reviewed with patient and updated if appropriate.  Patient Active Problem List   Diagnosis Date Noted  . Acute bronchitis 02/27/2019  . Nasal sore 02/27/2019  . Allergic rhinitis 10/27/2016  . Vertigo 10/27/2016  . Body aches 09/23/2016  . Concussion with no loss of consciousness 09/23/2016  . Neck pain 09/23/2016  . IDA (iron deficiency anemia) 03/23/2016  . GERD (gastroesophageal reflux disease) 05/28/2015  . ADD (attention deficit disorder) 05/28/2015  . Depression 05/28/2015  . Chronic fatigue 05/28/2015  . Joint pain  05/28/2015  . Family history of diabetes mellitus 05/28/2015  . Fibroid 05/28/2015  . Anemia, iron deficiency 05/28/2015  . Obese 05/28/2015  . Lymphadenopathy 06/27/2014    Current Outpatient Medications on File Prior to Visit  Medication Sig Dispense Refill  . albuterol (VENTOLIN HFA) 108 (90 Base) MCG/ACT inhaler Inhale 2 puffs into the lungs every 6 (six) hours as needed for wheezing or shortness of breath. 6.7 g 1  . amphetamine-dextroamphetamine (ADDERALL) 20 MG tablet Take 20 mg by mouth 2 (two) times daily.  0  . buPROPion (WELLBUTRIN XL) 150 MG 24 hr tablet Take 300 mg by mouth daily.   12  . cetirizine (ZYRTEC) 10 MG tablet Take 1 tablet (10 mg total) by mouth daily. Need office visit for more refills 30 tablet 0  . Cholecalciferol (VITAMIN D-3) 5000 units TABS Taking daily 30 tablet   . Fe Cbn-Fe Gluc-FA-B12-C-DSS (FERRALET 90) 90-1 MG TABS Take 1 tablet by mouth daily.  3  . fluticasone (FLONASE) 50 MCG/ACT nasal spray Place 2 sprays into both nostrils daily. -- Office visit needed for further refills 16 g 0  . mupirocin ointment (BACTROBAN) 2 % Place 1 application into the nose 2 (two) times daily. 22 g 0  . Omega-3 Fatty Acids (FISH OIL) 1200 MG CAPS Taking daily    . omeprazole (PRILOSEC) 20 MG capsule Take  1 capsule (20 mg total) by mouth as needed. 90 capsule 3  . sodium chloride (OCEAN) 0.65 % SOLN nasal spray Place 1 spray into both nostrils as needed for congestion. 15 mL 0   No current facility-administered medications on file prior to visit.    Past Medical History:  Diagnosis Date  . Anxiety   . Depression     No past surgical history on file.  Social History   Socioeconomic History  . Marital status: Single    Spouse name: Not on file  . Number of children: 0  . Years of education: Not on file  . Highest education level: Bachelor's degree (e.g., BA, AB, BS)  Occupational History  . Occupation: unemployed  Tobacco Use  . Smoking status: Never Smoker    . Smokeless tobacco: Never Used  . Tobacco comment: never used tobacco  Substance and Sexual Activity  . Alcohol use: No    Alcohol/week: 0.0 standard drinks  . Drug use: No  . Sexual activity: Not on file  Other Topics Concern  . Not on file  Social History Narrative   Single, lives alone. Rarely drinks caffeine. No regular exercise.   Social Determinants of Health   Financial Resource Strain:   . Difficulty of Paying Living Expenses: Not on file  Food Insecurity:   . Worried About Charity fundraiser in the Last Year: Not on file  . Ran Out of Food in the Last Year: Not on file  Transportation Needs:   . Lack of Transportation (Medical): Not on file  . Lack of Transportation (Non-Medical): Not on file  Physical Activity:   . Days of Exercise per Week: Not on file  . Minutes of Exercise per Session: Not on file  Stress:   . Feeling of Stress : Not on file  Social Connections:   . Frequency of Communication with Friends and Family: Not on file  . Frequency of Social Gatherings with Friends and Family: Not on file  . Attends Religious Services: Not on file  . Active Member of Clubs or Organizations: Not on file  . Attends Archivist Meetings: Not on file  . Marital Status: Not on file    Family History  Problem Relation Age of Onset  . Hypertension Mother   . Diabetes Mother   . Hyperlipidemia Mother   . Heart disease Mother   . Kidney disease Mother        HD  . Cancer Brother        mouth and throat  . Diabetes Brother     Review of Systems  Constitutional: Positive for fatigue. Negative for chills and fever.  HENT: Positive for congestion. Negative for ear pain, postnasal drip, sinus pressure, sinus pain and sore throat.   Respiratory: Positive for chest tightness (intermittent since PNA in August). Negative for cough, shortness of breath and wheezing.   Cardiovascular: Negative for chest pain, palpitations and leg swelling.  Gastrointestinal:  Positive for abdominal pain (fibroid pain, intermittent) and constipation. Negative for blood in stool (darker stool - ? from iron) and nausea.  Genitourinary: Positive for menstrual problem (heavy menses).  Neurological: Negative for light-headedness and headaches.       Objective:   Vitals:   03/21/19 0949  BP: 126/74  Pulse: 87  Resp: 16  Temp: 98.5 F (36.9 C)  SpO2: 96%   Filed Weights   03/21/19 0949  Weight: 183 lb (83 kg)   Body mass index is 29.54  kg/m.  BP Readings from Last 3 Encounters:  03/21/19 126/74  01/25/17 110/80  11/09/16 114/80    Wt Readings from Last 3 Encounters:  03/21/19 183 lb (83 kg)  01/25/17 165 lb 12.8 oz (75.2 kg)  11/09/16 167 lb 3.2 oz (75.8 kg)     Physical Exam Constitutional: She appears well-developed and well-nourished. No distress.  HENT:  Head: Normocephalic and atraumatic.  Right Ear: External ear normal. Normal ear canal and TM Left Ear: External ear normal.  Normal ear canal and TM Mouth/Throat: Oropharynx is clear and moist.  Eyes: Conjunctivae and EOM are normal.  Neck: Neck supple. No tracheal deviation present. No thyromegaly present.  Cardiovascular: Normal rate, regular rhythm and normal heart sounds.  No carotid bruit.  No murmur heard.  No edema. Pulmonary/Chest: Effort normal and breath sounds normal. No respiratory distress. She has no wheezes. She has no rales.  Lymphadenopathy: She has no cervical adenopathy.  Skin: Skin is warm and dry. She is not diaphoretic.  Psychiatric: She has a normal mood and affect. Her behavior is normal.        Assessment & Plan:    See problem list for assessment and plan.   This visit occurred during the SARS-CoV-2 public health emergency.  Safety protocols were in place, including screening questions prior to the visit, additional usage of staff PPE, and extensive cleaning of exam room while observing appropriate contact time as indicated for disinfecting solutions.

## 2019-03-20 NOTE — Patient Instructions (Addendum)
  Blood work was ordered.   A chest xray was ordered   Medications reviewed and updated.  Changes include :   none  Your prescription(s) have been submitted to your pharmacy. Please take as directed and contact our office if you believe you are having problem(s) with the medication(s).    Please followup in one year

## 2019-03-21 ENCOUNTER — Other Ambulatory Visit: Payer: Medicare Other

## 2019-03-21 ENCOUNTER — Other Ambulatory Visit: Payer: Self-pay

## 2019-03-21 ENCOUNTER — Ambulatory Visit (INDEPENDENT_AMBULATORY_CARE_PROVIDER_SITE_OTHER): Payer: Medicare Other

## 2019-03-21 ENCOUNTER — Encounter: Payer: Self-pay | Admitting: Internal Medicine

## 2019-03-21 ENCOUNTER — Ambulatory Visit (INDEPENDENT_AMBULATORY_CARE_PROVIDER_SITE_OTHER): Payer: Medicare Other | Admitting: Internal Medicine

## 2019-03-21 VITALS — BP 126/74 | HR 87 | Temp 98.5°F | Resp 16 | Ht 66.0 in | Wt 183.0 lb

## 2019-03-21 DIAGNOSIS — R5382 Chronic fatigue, unspecified: Secondary | ICD-10-CM

## 2019-03-21 DIAGNOSIS — R0789 Other chest pain: Secondary | ICD-10-CM | POA: Diagnosis not present

## 2019-03-21 DIAGNOSIS — D509 Iron deficiency anemia, unspecified: Secondary | ICD-10-CM | POA: Diagnosis not present

## 2019-03-21 DIAGNOSIS — J309 Allergic rhinitis, unspecified: Secondary | ICD-10-CM

## 2019-03-21 DIAGNOSIS — D219 Benign neoplasm of connective and other soft tissue, unspecified: Secondary | ICD-10-CM

## 2019-03-21 DIAGNOSIS — Z0000 Encounter for general adult medical examination without abnormal findings: Secondary | ICD-10-CM | POA: Diagnosis not present

## 2019-03-21 DIAGNOSIS — F988 Other specified behavioral and emotional disorders with onset usually occurring in childhood and adolescence: Secondary | ICD-10-CM | POA: Diagnosis not present

## 2019-03-21 DIAGNOSIS — F3289 Other specified depressive episodes: Secondary | ICD-10-CM | POA: Diagnosis not present

## 2019-03-21 DIAGNOSIS — R5383 Other fatigue: Secondary | ICD-10-CM

## 2019-03-21 DIAGNOSIS — K219 Gastro-esophageal reflux disease without esophagitis: Secondary | ICD-10-CM

## 2019-03-21 LAB — COMPREHENSIVE METABOLIC PANEL
ALT: 21 U/L (ref 0–35)
AST: 22 U/L (ref 0–37)
Albumin: 4.4 g/dL (ref 3.5–5.2)
Alkaline Phosphatase: 61 U/L (ref 39–117)
BUN: 8 mg/dL (ref 6–23)
CO2: 25 mEq/L (ref 19–32)
Calcium: 9.9 mg/dL (ref 8.4–10.5)
Chloride: 105 mEq/L (ref 96–112)
Creatinine, Ser: 0.8 mg/dL (ref 0.40–1.20)
GFR: 96.21 mL/min (ref >60.00)
Glucose, Bld: 85 mg/dL (ref 70–99)
Potassium: 3.8 mEq/L (ref 3.5–5.1)
Sodium: 137 mEq/L (ref 135–145)
Total Bilirubin: 0.2 mg/dL (ref 0.2–1.2)
Total Protein: 8.2 g/dL (ref 6.0–8.3)

## 2019-03-21 LAB — CBC WITH DIFFERENTIAL/PLATELET
Basophils Absolute: 0 10*3/uL (ref 0.0–0.1)
Basophils Relative: 0.4 % (ref 0.0–3.0)
Eosinophils Absolute: 0.1 10*3/uL (ref 0.0–0.7)
Eosinophils Relative: 2.4 % (ref 0.0–5.0)
HCT: 40.7 % (ref 36.0–46.0)
Hemoglobin: 13.6 g/dL (ref 12.0–15.0)
Lymphocytes Relative: 45.4 % (ref 12.0–46.0)
Lymphs Abs: 2.6 10*3/uL (ref 0.7–4.0)
MCHC: 33.6 g/dL (ref 30.0–36.0)
MCV: 95.1 fl (ref 78.0–100.0)
Monocytes Absolute: 0.7 10*3/uL (ref 0.1–1.0)
Monocytes Relative: 11.5 % (ref 3.0–12.0)
Neutro Abs: 2.3 10*3/uL (ref 1.4–7.7)
Neutrophils Relative %: 40.3 % — ABNORMAL LOW (ref 43.0–77.0)
Platelets: 372 10*3/uL (ref 150.0–400.0)
RBC: 4.27 Mil/uL (ref 3.87–5.11)
RDW: 13.5 % (ref 11.5–15.5)
WBC: 5.7 10*3/uL (ref 4.0–10.5)

## 2019-03-21 LAB — TSH: TSH: 1.84 u[IU]/mL (ref 0.35–4.50)

## 2019-03-21 MED ORDER — OMEPRAZOLE 20 MG PO CPDR: 20.0000 mg | DELAYED_RELEASE_CAPSULE | ORAL | 3 refills | Status: DC | PRN

## 2019-03-21 MED ORDER — FLUTICASONE PROPIONATE 50 MCG/ACT NA SUSP: 2.0000 | Freq: Every day | NASAL | 5 refills | Status: AC

## 2019-03-21 NOTE — Assessment & Plan Note (Signed)
Subacute Has intermittent chest tightness since pneumonia in August Has had some intermittent discolored sputum as well, but not consistently and may be related to acute URIs Will check chest x-ray Advised that if she has discolored mucus again she should make a visit-last episode was earlier this month and she likely had bronchitis and that resolved with an antibiotic

## 2019-03-21 NOTE — Assessment & Plan Note (Signed)
Chronic Has fibroids and heavy menses Following with GYN and currently on birth control, but still having heavy menses Taking iron daily We will check CBC, iron panel

## 2019-03-21 NOTE — Assessment & Plan Note (Signed)
Chronic Has pain intermittently and heavy menses Following with gynecology She did ask about pain medication and advised that she see her gynecologist or take NSAIDs or Tylenol over-the-counter

## 2019-03-21 NOTE — Assessment & Plan Note (Signed)
Chronic Having GERD a couple times a week and takes medication as needed Okay to continue Encourage weight loss and revision of diet to help prevent GERD

## 2019-03-21 NOTE — Assessment & Plan Note (Signed)
Chronic fatigue Likely multifactorial in nature We will check CBC, CMP, iron levels and TSH Encourage regular exercise

## 2019-03-21 NOTE — Assessment & Plan Note (Signed)
Chronic Year-long Currently taking Zyrtec 10 mg daily and Mucinex as needed Chronic stuffiness Advised she may need to switch Zyrtec to a different oral antihistamine Restart Flonase-prescription sent to pharmacy No evidence of infection or need for other medication at this time

## 2019-03-22 ENCOUNTER — Telehealth: Payer: Self-pay

## 2019-03-22 LAB — IRON,TIBC AND FERRITIN PANEL
%SAT: 24 % (calc) (ref 16–45)
Ferritin: 31 ng/mL (ref 16–154)
Iron: 79 ug/dL (ref 40–190)
TIBC: 334 mcg/dL (calc) (ref 250–450)

## 2019-03-22 NOTE — Telephone Encounter (Signed)
New message    *STAT* If patient is at the pharmacy, call can be transferred to refill team.   1. Which medications need to be refilled? (please list name of each medication and dose if known) Zpack  2. Which pharmacy/location (including street and city if local pharmacy) is medication to be sent to? [facility] on [address]   3. Do they need a 30 day or 90 day supply?

## 2019-03-23 MED ORDER — AZITHROMYCIN 250 MG PO TABS: ORAL_TABLET | ORAL | 0 refills | Status: DC

## 2019-03-23 NOTE — Telephone Encounter (Signed)
Sent to pharmacy 

## 2019-03-23 NOTE — Telephone Encounter (Signed)
You saw pt on 1/27. Not sure if you want an OV or if this was discussed at [place]. She is requesting a zpak. Please advise

## 2019-03-23 NOTE — Telephone Encounter (Signed)
Pt aware.

## 2019-06-08 ENCOUNTER — Encounter: Payer: Self-pay | Admitting: Internal Medicine

## 2019-06-08 ENCOUNTER — Telehealth (INDEPENDENT_AMBULATORY_CARE_PROVIDER_SITE_OTHER): Payer: Medicare Other | Admitting: Internal Medicine

## 2019-06-08 DIAGNOSIS — J309 Allergic rhinitis, unspecified: Secondary | ICD-10-CM

## 2019-06-08 MED ORDER — AZITHROMYCIN 250 MG PO TABS: ORAL_TABLET | ORAL | 0 refills | Status: DC

## 2019-06-08 NOTE — Assessment & Plan Note (Signed)
Suspect flare of this. Likely worse due to mold in home. Rx azithromycin and asked her to get tested for covid-19.

## 2019-06-08 NOTE — Progress Notes (Signed)
Virtual Visit via Video Note  I connected with Stephanie Werner on 06/08/19 at  2:20 PM EDT by a video enabled telemedicine application and verified that I am speaking with the correct person using two identifiers.  The patient and the provider were at separate locations throughout the entire encounter.   I discussed the limitations of evaluation and management by telemedicine and the availability of in person appointments. The patient expressed understanding and agreed to proceed. The patient and the provider were the only parties present for the visit unless noted in HPI below.  History of Present Illness: The patient is a 39 y.o. female with visit for congestion and cough. Coughing up yellow mucus in the morning feels like it is coming from her throat. With chronic allergies and feels mold in the home exacerbates this. She had similar in January and treated with doxycycline and then a z-pack which worked better. Started about 1 week ago. Denies fevers or chills. Has fatigue. No known covid-19 contacts but has not been tested. Denies loss of taste or smell. Takes allergy medications and denies missing doses Overall it is not improving.   Observations/Objective: Appearance: normal, breathing appears normal no coughing or dyspnea during visit, casual grooming, abdomen does not appear distended, throat with redness, memory normal, mental status is A and O times 3  Assessment and Plan: See problem oriented charting  Follow Up Instructions: Rx azithromycin, suspect mold could be triggering her severe allergy symptoms. Advised to get tested for covid-19 if antibiotics not helping in 1-2 days as she is not vaccinated and has symptoms which could be similar to covid-19.   I discussed the assessment and treatment plan with the patient. The patient was provided an opportunity to ask questions and all were answered. The patient agreed with the plan and demonstrated an understanding of the instructions.    The patient was advised to call back or seek an in-person evaluation if the symptoms worsen or if the condition fails to improve as anticipated.  Hoyt Koch, MD

## 2019-06-19 NOTE — Progress Notes (Signed)
Subjective:    Patient ID: Stephanie Werner, female    DOB: January 05, 1981, 39 y.o.   MRN: [id number]  HPI The patient is here for an acute visit.  A couple of weeks ago she was spraying a pesticide and it soaked into both hands.  She did wash it off but it took about 5 minutes to do that.  She felt pain up both arms - like a pin pricking sensation.    She had some redness in the skin which may have been from washing everything.  She denies any rashes or hives.  There was not cold or asthma like symptoms.  She did call poison control and they just recommended washing well.    She has occasional twinges of pain in her arms.  She has noticed a lump or swelling in both of her proximal forearm - she has not noticed this prior.  It may just fat.  She was concerned about fat lumps or cancer.     Medications and allergies reviewed with patient and updated if appropriate.  Patient Active Problem List   Diagnosis Date Noted  . Chest tightness 03/21/2019  . Nasal sore 02/27/2019  . Allergic rhinitis 10/27/2016  . Vertigo 10/27/2016  . Concussion with no loss of consciousness 09/23/2016  . IDA (iron deficiency anemia) 03/23/2016  . GERD (gastroesophageal reflux disease) 05/28/2015  . ADD (attention deficit disorder) 05/28/2015  . Depression 05/28/2015  . Fatigue 05/28/2015  . Joint pain 05/28/2015  . Family history of diabetes mellitus 05/28/2015  . Fibroid 05/28/2015  . Anemia, iron deficiency 05/28/2015  . Obese 05/28/2015  . Lymphadenopathy 06/27/2014    Current Outpatient Medications on File Prior to Visit  Medication Sig Dispense Refill  . albuterol (VENTOLIN HFA) 108 (90 Base) MCG/ACT inhaler Inhale 2 puffs into the lungs every 6 (six) hours as needed for wheezing or shortness of breath. 6.7 g 1  . amphetamine-dextroamphetamine (ADDERALL) 20 MG tablet Take 20 mg by mouth 2 (two) times daily.  0  . buPROPion (WELLBUTRIN XL) 150 MG 24 hr tablet Take 300 mg by mouth daily.   12  .  cetirizine (ZYRTEC) 10 MG tablet Take 1 tablet (10 mg total) by mouth daily. Need office visit for more refills 30 tablet 0  . Cholecalciferol (VITAMIN D-3) 5000 units TABS Taking daily 30 tablet   . Fe Cbn-Fe Gluc-FA-B12-C-DSS (FERRALET 90) 90-1 MG TABS Take 1 tablet by mouth daily.  3  . fluticasone (FLONASE) 50 MCG/ACT nasal spray Place 2 sprays into both nostrils daily. 16 g 5  . Omega-3 Fatty Acids (FISH OIL) 1200 MG CAPS Taking daily    . omeprazole (PRILOSEC) 20 MG capsule Take 1 capsule (20 mg total) by mouth as needed. 90 capsule 3  . sodium chloride (OCEAN) 0.65 % SOLN nasal spray Place 1 spray into both nostrils as needed for congestion. 15 mL 0   No current facility-administered medications on file prior to visit.    Past Medical History:  Diagnosis Date  . Anxiety   . Depression     History reviewed. No pertinent surgical history.  Social History   Socioeconomic History  . Marital status: Single    Spouse name: Not on file  . Number of children: 0  . Years of education: Not on file  . Highest education level: Bachelor's degree (e.g., BA, AB, BS)  Occupational History  . Occupation: unemployed  Tobacco Use  . Smoking status: Never Smoker  . Smokeless tobacco:  Never Used  . Tobacco comment: never used tobacco  Substance and Sexual Activity  . Alcohol use: No    Alcohol/week: 0.0 standard drinks  . Drug use: No  . Sexual activity: Not on file  Other Topics Concern  . Not on file  Social History Narrative   Single, lives alone. Rarely drinks caffeine. No regular exercise.   Social Determinants of Health   Financial Resource Strain:   . Difficulty of Paying Living Expenses:   Food Insecurity:   . Worried About Charity fundraiser in the Last Year:   . Arboriculturist in the Last Year:   Transportation Needs:   . Film/video editor (Medical):   Marland Kitchen Lack of Transportation (Non-Medical):   Physical Activity:   . Days of Exercise per Week:   . Minutes of  Exercise per Session:   Stress:   . Feeling of Stress :   Social Connections:   . Frequency of Communication with Friends and Family:   . Frequency of Social Gatherings with Friends and Family:   . Attends Religious Services:   . Active Member of Clubs or Organizations:   . Attends Archivist Meetings:   Marland Kitchen Marital Status:     Family History  Problem Relation Age of Onset  . Hypertension Mother   . Diabetes Mother   . Hyperlipidemia Mother   . Heart disease Mother   . Kidney disease Mother        HD  . Cancer Brother        mouth and throat  . Diabetes Brother     Review of Systems Per HPI    Objective:   Vitals:   06/20/19 0858  BP: 136/72  Pulse: (!) 102  Resp: 16  Temp: 99.3 F (37.4 C)  SpO2: 98%   BP Readings from Last 3 Encounters:  06/20/19 136/72  03/21/19 126/74  01/25/17 110/80   Wt Readings from Last 3 Encounters:  06/20/19 175 lb (79.4 kg)  03/21/19 183 lb (83 kg)  01/25/17 165 lb 12.8 oz (75.2 kg)   Body mass index is 28.25 kg/m.   Physical Exam Constitutional:      General: She is not in acute distress.    Appearance: Normal appearance. She is not ill-appearing.  HENT:     Head: Normocephalic and atraumatic.  Pulmonary:     Effort: Pulmonary effort is normal.  Musculoskeletal:     Comments: B/l forearms appear normal. The area of concern is her lateral proximal forearm - no palpable lumps, no tender with palpation or movement or arm/elbow, the area is points to is the proximal brachioradialis muscle and it appears and feels normal  Skin:    General: Skin is warm and dry.     Findings: No erythema, lesion or rash.  Neurological:     Mental Status: She is alert.            Assessment & Plan:    See Problem List for Assessment and Plan of chronic medical problems.     This visit occurred during the SARS-CoV-2 public health emergency.  Safety protocols were in place, including screening questions prior to the visit,  additional usage of staff PPE, and extensive cleaning of exam room while observing appropriate contact time as indicated for disinfecting solutions.

## 2019-06-20 ENCOUNTER — Ambulatory Visit (INDEPENDENT_AMBULATORY_CARE_PROVIDER_SITE_OTHER): Payer: Medicare Other | Admitting: Internal Medicine

## 2019-06-20 ENCOUNTER — Encounter: Payer: Self-pay | Admitting: Internal Medicine

## 2019-06-20 ENCOUNTER — Other Ambulatory Visit: Payer: Self-pay

## 2019-06-20 VITALS — BP 136/72 | HR 102 | Temp 99.3°F | Resp 16 | Ht 66.0 in | Wt 175.0 lb

## 2019-06-20 DIAGNOSIS — M7989 Other specified soft tissue disorders: Secondary | ICD-10-CM | POA: Diagnosis not present

## 2019-06-20 DIAGNOSIS — Z77098 Contact with and (suspected) exposure to other hazardous, chiefly nonmedicinal, chemicals: Secondary | ICD-10-CM | POA: Insufficient documentation

## 2019-06-20 NOTE — Assessment & Plan Note (Signed)
Acute She is concerned about swelling in her bilateral proximal forearms-concerned about muscle injury, fat lumps, cancer Her exam is normal-the area of concern looks like normal muscle tissue There is no pain, abnormal lumps Reassured her No further evaluation is necessary

## 2019-06-20 NOTE — Assessment & Plan Note (Signed)
Acute Occurred two weeks ago  No concerning side effects  No further treatment or evaluation

## 2019-08-08 ENCOUNTER — Other Ambulatory Visit: Payer: Self-pay | Admitting: Internal Medicine

## 2019-09-07 ENCOUNTER — Telehealth: Payer: Self-pay | Admitting: Internal Medicine

## 2019-09-07 DIAGNOSIS — K5900 Constipation, unspecified: Secondary | ICD-10-CM

## 2019-09-07 DIAGNOSIS — R195 Other fecal abnormalities: Secondary | ICD-10-CM

## 2019-09-07 NOTE — Telephone Encounter (Signed)
Pt also states she is having some dark stools as well but thinks it may be coming from the iron supplements she is currently taking too. She states she also has some soreness too.

## 2019-09-07 NOTE — Telephone Encounter (Signed)
I can put in the referral to GI for her to evaluate the constipation but she should go on and make appointment here first. They won't be able to see her immediately and need to make sure there is not another issue with the dark stools.

## 2019-09-07 NOTE — Telephone Encounter (Signed)
New message:   Pt is calling and would like a referral to go see the GI doctor. Pt states she is having constipation and swelling in her stomach. Please advise.

## 2019-09-07 NOTE — Telephone Encounter (Signed)
Called and left message for patient and also sent her a my-chart message.

## 2019-09-19 ENCOUNTER — Encounter: Payer: Self-pay | Admitting: Family

## 2019-10-09 NOTE — Patient Instructions (Addendum)
   A referral was ordered for GI and dermatology.  Someone from their office will call you to schedule an appointment.

## 2019-10-09 NOTE — Progress Notes (Signed)
Subjective:    Patient ID: Stephanie Werner, female    DOB: 13-Sep-1980, 38 y.o.   MRN: [id number]  HPI The patient is here for an acute visit to get referrals.   Referrals needed for GI, pancreas, skin.  GERD:  She is taking her medication daily as prescribed.  She denies any GERD symptoms and feels her GERD is well controlled.    She has fibroids.  She is on iron.  She is taking fiber, colace, hemp seeds, psyllium husk and drinks plenty of water and is still bloating and constipated.   She has bloating and increased gas.  She would like to see GI.   She has acne and a skin burn from doing dermabrasion at home.   She did at home electrolysis as well and has hyperpigmentation.  She wants to see derm.  She has tried several otc things and nothing has helped.      Years ago she had severe abdominal pain.  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  She had blood infusions at that time and was seeing hem/onc.  There was no change in the LN's over a few years and it was advised no further testing was needed.  She was concerned about this and wanted GI's input.    Medications and allergies reviewed with patient and updated if appropriate.  Patient Active Problem List   Diagnosis Date Noted   Constipation 10/10/2019   Hyperpigmentation 10/10/2019   Acne 10/10/2019   Exposure to pesticide 06/20/2019   Swelling of soft tissue of forearm 06/20/2019   Chest tightness 03/21/2019   Nasal sore 02/27/2019   Allergic rhinitis 10/27/2016   Vertigo 10/27/2016   Concussion with no loss of consciousness 09/23/2016   IDA (iron deficiency anemia) 03/23/2016   GERD (gastroesophageal reflux disease) 05/28/2015   ADD (attention deficit disorder) 05/28/2015   Depression 05/28/2015   Fatigue 05/28/2015   Joint pain 05/28/2015   Family history of diabetes mellitus 05/28/2015   Fibroid 05/28/2015   Anemia, iron deficiency 05/28/2015     Obese 05/28/2015   Lymphadenopathy 06/27/2014    Current Outpatient Medications on File Prior to Visit  Medication Sig Dispense Refill   albuterol (VENTOLIN HFA) 108 (90 Base) MCG/ACT inhaler INHALE 2 PUFFS BY MOUTH EVERY 6 HOURS AS NEEDED FOR WHEEZING AND FOR SHORTNESS OF BREATH 18 g 0   amphetamine-dextroamphetamine (ADDERALL) 20 MG tablet Take 20 mg by mouth 2 (two) times daily.  0   buPROPion (WELLBUTRIN XL) 150 MG 24 hr tablet Take 300 mg by mouth daily.   12   buPROPion (WELLBUTRIN XL) 300 MG 24 hr tablet Take 300 mg by mouth every morning.     cetirizine (ZYRTEC) 10 MG tablet Take 1 tablet (10 mg total) by mouth daily. Need office visit for more refills 30 tablet 0   Cholecalciferol (VITAMIN D-3) 5000 units TABS Taking daily 30 tablet    Fe Cbn-Fe Gluc-FA-B12-C-DSS (FERRALET 90) 90-1 MG TABS Take 1 tablet by mouth daily.  3   fluticasone (FLONASE) 50 MCG/ACT nasal spray Place 2 sprays into both nostrils daily. 16 g 5   norethindrone (MICRONOR) 0.35 MG tablet Take 1 tablet by mouth daily.     Omega-3 Fatty Acids (FISH OIL) 1200 MG CAPS Taking daily     omeprazole (PRILOSEC) 20 MG capsule Take 1 capsule (20 mg total) by mouth as needed. 90 capsule 3  sodium chloride (OCEAN) 0.65 % SOLN nasal spray Place 1 spray into both nostrils as needed for congestion. 15 mL 0   No current facility-administered medications on file prior to visit.    Past Medical History:  Diagnosis Date   Anxiety    Depression     History reviewed. No pertinent surgical history.  Social History   Socioeconomic History   Marital status: Single    Spouse name: Not on file   Number of children: 0   Years of education: Not on file   Highest education level: Bachelor's degree (e.g., BA, AB, BS)  Occupational History   Occupation: unemployed  Tobacco Use   Smoking status: Never Smoker   Smokeless tobacco: Never Used   Tobacco comment: never used tobacco  Substance and Sexual  Activity   Alcohol use: No    Alcohol/week: 0.0 standard drinks   Drug use: No   Sexual activity: Not on file  Other Topics Concern   Not on file  Social History Narrative   Single, lives alone. Rarely drinks caffeine. No regular exercise.   Social Determinants of Health   Financial Resource Strain:    Difficulty of Paying Living Expenses:   Food Insecurity:    Worried About Charity fundraiser in the Last Year:    Arboriculturist in the Last Year:   Transportation Needs:    Film/video editor (Medical):    Lack of Transportation (Non-Medical):   Physical Activity:    Days of Exercise per Week:    Minutes of Exercise per Session:   Stress:    Feeling of Stress :   Social Connections:    Frequency of Communication with Friends and Family:    Frequency of Social Gatherings with Friends and Family:    Attends Religious Services:    Active Member of Clubs or Organizations:    Attends Music therapist:    Marital Status:     Family History  Problem Relation Age of Onset   Hypertension Mother    Diabetes Mother    Hyperlipidemia Mother    Heart disease Mother    Kidney disease Mother        HD   Cancer Brother        mouth and throat   Diabetes Brother     Review of Systems     Objective:   Vitals:   10/10/19 0942  BP: 130/80  Pulse: 86  Temp: 98.1 F (36.7 C)  SpO2: 98%   BP Readings from Last 3 Encounters:  10/10/19 130/80  06/20/19 136/72  03/21/19 126/74   Wt Readings from Last 3 Encounters:  10/10/19 176 lb (79.8 kg)  06/20/19 175 lb (79.4 kg)  03/21/19 183 lb (83 kg)   Body mass index is 28.41 kg/m.   Physical Exam         Assessment & Plan:    See Problem List for Assessment and Plan of chronic medical problems.    This visit occurred during the SARS-CoV-2 public health emergency.  Safety protocols were in place, including screening questions prior to the visit, additional usage of staff  PPE, and extensive cleaning of exam room while observing appropriate contact time as indicated for disinfecting solutions.

## 2019-10-10 ENCOUNTER — Other Ambulatory Visit: Payer: Self-pay

## 2019-10-10 ENCOUNTER — Encounter: Payer: Self-pay | Admitting: Internal Medicine

## 2019-10-10 ENCOUNTER — Ambulatory Visit (INDEPENDENT_AMBULATORY_CARE_PROVIDER_SITE_OTHER): Payer: Medicare Other | Admitting: Internal Medicine

## 2019-10-10 ENCOUNTER — Other Ambulatory Visit: Payer: Self-pay | Admitting: Internal Medicine

## 2019-10-10 VITALS — BP 130/80 | HR 86 | Temp 98.1°F | Ht 66.0 in | Wt 176.0 lb

## 2019-10-10 DIAGNOSIS — K5900 Constipation, unspecified: Secondary | ICD-10-CM | POA: Diagnosis not present

## 2019-10-10 DIAGNOSIS — K219 Gastro-esophageal reflux disease without esophagitis: Secondary | ICD-10-CM

## 2019-10-10 DIAGNOSIS — L709 Acne, unspecified: Secondary | ICD-10-CM

## 2019-10-10 DIAGNOSIS — L819 Disorder of pigmentation, unspecified: Secondary | ICD-10-CM | POA: Insufficient documentation

## 2019-10-10 DIAGNOSIS — K5909 Other constipation: Secondary | ICD-10-CM | POA: Insufficient documentation

## 2019-10-10 DIAGNOSIS — R591 Generalized enlarged lymph nodes: Secondary | ICD-10-CM

## 2019-10-10 NOTE — Assessment & Plan Note (Signed)
Chronic GERD controlled Continue prn omeprazole

## 2019-10-10 NOTE — Assessment & Plan Note (Signed)
Referred to derm 

## 2019-10-10 NOTE — Assessment & Plan Note (Signed)
Abdominal - stable when monitored by hem/onc No additional thought to be necessary Will review with GI -her concern is the pancreas

## 2019-10-10 NOTE — Assessment & Plan Note (Signed)
Chronic Not ideally controlled Taking fiber, colace, psyllium, and natural remedies Bloated and has increased gas, still feels constipated Wants to see GI - referred

## 2019-10-23 ENCOUNTER — Other Ambulatory Visit: Payer: Self-pay | Admitting: Internal Medicine

## 2019-10-25 ENCOUNTER — Encounter: Payer: Self-pay | Admitting: Gastroenterology

## 2019-11-06 DIAGNOSIS — L858 Other specified epidermal thickening: Secondary | ICD-10-CM | POA: Insufficient documentation

## 2019-12-24 ENCOUNTER — Ambulatory Visit: Payer: Medicare Other | Admitting: Gastroenterology

## 2020-01-10 ENCOUNTER — Encounter: Payer: Self-pay | Admitting: Gastroenterology

## 2020-01-10 ENCOUNTER — Ambulatory Visit (INDEPENDENT_AMBULATORY_CARE_PROVIDER_SITE_OTHER): Payer: Medicare Other | Admitting: Gastroenterology

## 2020-01-10 ENCOUNTER — Other Ambulatory Visit: Payer: Medicare Other

## 2020-01-10 VITALS — BP 106/70 | HR 101 | Ht 60.0 in | Wt 179.0 lb

## 2020-01-10 DIAGNOSIS — K5909 Other constipation: Secondary | ICD-10-CM

## 2020-01-10 DIAGNOSIS — R195 Other fecal abnormalities: Secondary | ICD-10-CM

## 2020-01-10 DIAGNOSIS — R599 Enlarged lymph nodes, unspecified: Secondary | ICD-10-CM

## 2020-01-10 DIAGNOSIS — R1084 Generalized abdominal pain: Secondary | ICD-10-CM

## 2020-01-10 DIAGNOSIS — K625 Hemorrhage of anus and rectum: Secondary | ICD-10-CM | POA: Diagnosis not present

## 2020-01-10 DIAGNOSIS — K219 Gastro-esophageal reflux disease without esophagitis: Secondary | ICD-10-CM

## 2020-01-10 DIAGNOSIS — R140 Abdominal distension (gaseous): Secondary | ICD-10-CM

## 2020-01-10 LAB — COMPREHENSIVE METABOLIC PANEL
ALT: 23 U/L (ref 0–35)
AST: 19 U/L (ref 0–37)
Albumin: 4.6 g/dL (ref 3.5–5.2)
Alkaline Phosphatase: 62 U/L (ref 39–117)
BUN: 8 mg/dL (ref 6–23)
CO2: 25 mEq/L (ref 19–32)
Calcium: 10 mg/dL (ref 8.4–10.5)
Chloride: 104 mEq/L (ref 96–112)
Creatinine, Ser: 0.73 mg/dL (ref 0.40–1.20)
GFR: 102.72 mL/min (ref >60.00)
Glucose, Bld: 86 mg/dL (ref 70–99)
Potassium: 4.2 mEq/L (ref 3.5–5.1)
Sodium: 137 mEq/L (ref 135–145)
Total Bilirubin: 0.2 mg/dL (ref 0.2–1.2)
Total Protein: 8.5 g/dL — ABNORMAL HIGH (ref 6.0–8.3)

## 2020-01-10 LAB — CBC
HCT: 40.5 % (ref 36.0–46.0)
Hemoglobin: 13.9 g/dL (ref 12.0–15.0)
MCHC: 34.3 g/dL (ref 30.0–36.0)
MCV: 93 fl (ref 78.0–100.0)
Platelets: 365 10*3/uL (ref 150.0–400.0)
RBC: 4.36 Mil/uL (ref 3.87–5.11)
RDW: 13.3 % (ref 11.5–15.5)
WBC: 5.6 10*3/uL (ref 4.0–10.5)

## 2020-01-10 LAB — TSH: TSH: 1.69 u[IU]/mL (ref 0.35–4.50)

## 2020-01-10 LAB — SEDIMENTATION RATE: Sed Rate: 38 mm/hr — ABNORMAL HIGH (ref 0–20)

## 2020-01-10 LAB — GAMMA GT: GGT: 41 U/L (ref 7–51)

## 2020-01-10 LAB — PROTIME-INR
INR: 1 ratio (ref 0.8–1.0)
Prothrombin Time: 10.7 s (ref 9.6–13.1)

## 2020-01-10 LAB — HIGH SENSITIVITY CRP: CRP, High Sensitivity: 2.4 mg/L (ref 0.000–5.000)

## 2020-01-10 MED ORDER — SUPREP BOWEL PREP KIT 17.5-3.13-1.6 GM/177ML PO SOLN: 1.0000 | ORAL | 0 refills | Status: AC

## 2020-01-10 NOTE — Patient Instructions (Addendum)
If you are age 40 or older, your body mass index should be between 23-30. Your Body mass index is 34.96 kg/m. If this is out of the aforementioned range listed, please consider follow up with your Primary Care Provider.  If you are age 74 or younger, your body mass index should be between 19-25. Your Body mass index is 34.96 kg/m. If this is out of the aformentioned range listed, please consider follow up with your Primary Care Provider.    We have sent the following medications to your pharmacy for you to pick up at your convenience: Suprep    START: Linzess - once daily for 2 weeks ( samples given today.) Please call or send mychart message letting us know if Linzess helps. If so a prescription will be sent to your pharmacy.   START: Fiber Con - 1-2 tablets daily. ( over the counter)  Your provider has requested that you go to the basement level for lab work before leaving today. Press "B" on the elevator. The lab is located at the first door on the left as you exit the elevator.   You have been scheduled for a CT scan of the abdomen and pelvis at [facility], 1st floor Radiology. You are scheduled on 01/24/20  at 8:30am. You should arrive(8:15am) 15 minutes prior to your appointment time for registration.   The solution may taste better if refrigerated, but do NOT add ice or any other liquid to this solution. Shake well before drinking.   Please follow the written instructions below on the day of your exam:   1) Do not eat anything after 4:30am (4 hours prior to your test)   2) Drink 1 bottle of contrast @ 6:30am (2 hours prior to your exam)  Remember to shake well before drinking and do NOT pour over ice.     Drink 1 bottle of contrast @ 7:30am (1 hour prior to your exam)   You may take any medications as prescribed with a small amount of water, if necessary. If you take any of the following medications: METFORMIN, GLUCOPHAGE, GLUCOVANCE, AVANDAMET, RIOMET, FORTAMET,  ACTOPLUS MET, JANUMET, GLUMETZA or METAGLIP, you MAY be asked to HOLD this medication 48 hours AFTER the exam.   The purpose of you drinking the oral contrast is to aid in the visualization of your intestinal tract. The contrast solution may cause some diarrhea. Depending on your individual set of symptoms, you may also receive an intravenous injection of x-ray contrast/dye. Plan on being at [facility] for 45 minutes or longer, depending on the type of exam you are having performed.   If you have any questions regarding your exam or if you need to reschedule, you may call [facility] at [phone] between the hours of 8:00 am and 5:00 pm, Monday-Friday.    Toileting tips to help with your constipation - Drink at least 64-80 ounces of water/liquid per day. - Establish a time to try to move your bowels every day.  For many people, this is after a cup of coffee or after a meal such as breakfast. - Sit all of the way back on the toilet keeping your back fairly straight and while sitting up, try to rest the tops of your forearms on your upper thighs.   - Raising your feet with a step stool/squatty potty can be helpful to improve the angle that allows your stool to pass through the rectum. - Relax the rectum feeling it bulge toward the  toilet water.  If you feel your rectum raising toward your body, you are contracting rather than relaxing. - Breathe in and slowly exhale. "Belly breath" by expanding your belly towards your belly button. Keep belly expanded as you gently direct pressure down and back to the anus.  A low pitched GRRR sound can assist with increasing intra-abdominal pressure.  - Repeat 3-4 times. If unsuccessful, contract the pelvic floor to restore normal tone and get off the toilet.  Avoid excessive straining. - To reduce excessive wiping by teaching your anus to normally contract, place hands on outer aspect of knees and resist knee movement outward.  Hold 5-10 second then  place hands just inside of knees and resist inward movement of knees.  Hold 5 seconds.  Repeat a few times each way.   Thank you for choosing me and [facility].  Dr. Rush Landmark

## 2020-01-11 DIAGNOSIS — R1084 Generalized abdominal pain: Secondary | ICD-10-CM | POA: Insufficient documentation

## 2020-01-11 DIAGNOSIS — R195 Other fecal abnormalities: Secondary | ICD-10-CM | POA: Insufficient documentation

## 2020-01-11 DIAGNOSIS — K625 Hemorrhage of anus and rectum: Secondary | ICD-10-CM | POA: Insufficient documentation

## 2020-01-11 DIAGNOSIS — R140 Abdominal distension (gaseous): Secondary | ICD-10-CM | POA: Insufficient documentation

## 2020-01-11 LAB — IGA: Immunoglobulin A: 250 mg/dL (ref 47–310)

## 2020-01-11 LAB — TISSUE TRANSGLUTAMINASE, IGA: (tTG) Ab, IgA: <1 U/mL

## 2020-01-11 NOTE — Progress Notes (Signed)
[place] VISIT   Primary Care Provider Binnie Rail, MD [place] [address] [phone]  Referring Provider Binnie Rail, MD [address] [place],  [place] [address] [phone]  Patient Profile: Stephanie Werner is a 39 y.o. female with a pmh significant for anxiety/depression, fibromyalgia, arthritis, obesity, fibroids, IDA (on replacement), ?Possible family history of colon cancer (mother), ?IBS-C, GERD, chronic constipation, hemorrhoids, hemorrhoids, ?prior anal fissure.  The patient presents to the [facility] for an evaluation and management of problem(s) noted below:  Problem List 1. Generalized abdominal pain   2. Chronic constipation   3. Rectal bleeding   4. Dark stools   5. Gastroesophageal reflux disease, unspecified whether esophagitis present   6. Bloating symptom   7. Lymph nodes enlarged     History of Present Illness This is the patient's 1st visit to the outpatient [place] clinic.  The patient is referred initially because of issues of abdominal pain and constipation but has circled many issues.  She has cervical abdominal pain, acid reflux/pyrosis, bloating, flatulence, rectal pain/anal discomfort, tarry stools, constipation, diarrhea, incontinence, hemorrhoids, irritable bowel syndrome, rectal bleeding, possible fatty liver disease.  The patient understands that today's initial visit is to give [place] as much information as possible and we will try to work with her over the course of the coming months.  She also describes a history of "swollen lymph nodes near her pancreas" and a prior episode of pancreatitis that she would like to have a better understanding of and follow-up.  The patient has had constipation for years.  She cannot recall if she had this as a young child but does recall in her late teens and twenties beginning to have to do vaginal pushing and insertion of her digits to  allow pushing of her stool outwards.  She has a bowel movement every other day.  She is used castor oil as well as enemas in the past.  She is used stool softeners as well as probiotics and some fiber supplements.  She has never used [employer].  She has noted bright red blood per rectum in the setting what she believes to be hemorrhoidal bleeding in the past.  This does not occur every day.  The patient has pain and bloating.  After she has a bowel movement she does have some improvement in her discomfort at times.  Patient has noted dark and tarry stools past as well.  She has GERD but is well controlled.  She describes history of fibroids for which she describes her gynecologist having recommended previously to have a hysterectomy but as she is not had children yet she does not feel comfortable with this as of yet.  Years ago in 2015 the patient was found to have on a CT scan periportal and peripancreatic lymph nodes that were followed up with subsequent CT imaging being negative.  The etiology of these was never understood.  There was no evidence of pancreatitis or lipase elevation at the time of this CT scan so as not clear that she ever had true pancreatitis.  She does not know for sure but it is possible that her mother may have had colon cancer.  There is celiac or family of her direct members.  Describes having a possible fatty liver and she has been taking milk thistle for this.  Patient takes many different supplements/over-the-counter medications.  The patient is currently disabled.  The patient has never had an upper  or lower endoscopy the patient does not take significant nonsteroidals or BC/Goody powders.  GI Review of Systems Positive as above Negative for dysphagia, odynophagia, nausea, vomiting   Review of Systems General: Denies fevers/chills/weight loss unintentionally HEENT: Denies oral lesions Cardiovascular: Denies chest pain/palpitations Pulmonary: Denies shortness of  breath/nocturnal cough Gastroenterological: See HPI Genitourinary: Denies darkened urine Hematological: Denies easy bruising/bleeding Endocrine: Denies temperature intolerance Dermatological: Denies jaundice Psychological: Mood is stable   Medications Current Outpatient Medications  Medication Sig Dispense Refill  . albuterol (VENTOLIN HFA) 108 (90 Base) MCG/ACT inhaler INHALE 2 PUFFS BY MOUTH EVERY 6 HOURS AS NEEDED FOR WHEEZING AND FOR SHORTNESS OF BREATH 18 g 2  . amphetamine-dextroamphetamine (ADDERALL) 20 MG tablet Take 20 mg by mouth 2 (two) times daily.  0  . buPROPion (WELLBUTRIN XL) 300 MG 24 hr tablet Take 300 mg by mouth every morning.    . cetirizine (ZYRTEC) 10 MG tablet Take 1 tablet (10 mg total) by mouth daily. Need office visit for more refills 30 tablet 0  . Cholecalciferol (VITAMIN D-3) 5000 units TABS Taking daily 30 tablet   . Fe Cbn-Fe Gluc-FA-B12-C-DSS (FERRALET 90) 90-1 MG TABS Take 1 tablet by mouth daily.  3  . fluticasone (FLONASE) 50 MCG/ACT nasal spray Place 2 sprays into both nostrils daily. 16 g 5  . mupirocin ointment (BACTROBAN) 2 % APPLY  OINTMENT TO EACH NOSTRIL TWICE DAILY 22 g 0  . norethindrone (MICRONOR) 0.35 MG tablet Take 1 tablet by mouth daily.    . Omega-3 Fatty Acids (FISH OIL) 1200 MG CAPS Taking daily    . omeprazole (PRILOSEC) 20 MG capsule Take 1 capsule (20 mg total) by mouth as needed. 90 capsule 3  . sodium chloride (OCEAN) 0.65 % SOLN nasal spray Place 1 spray into both nostrils as needed for congestion. 15 mL 0  . Na Sulfate-K Sulfate-Mg Sulf (SUPREP BOWEL PREP KIT) 17.5-3.13-1.6 GM/177ML SOLN Take 1 kit by mouth as directed. For colonoscopy prep 354 mL 0   No current facility-administered medications for this visit.    Allergies Allergies  Allergen Reactions  . Penicillins Rash    Has patient had a PCN reaction causing immediate rash, facial/tongue/throat swelling, SOB or lightheadedness with hypotension: No Has patient had a  PCN reaction causing severe rash involving mucus membranes or skin necrosis: } Has patient had a PCN reaction that required hospitalization: no Has patient had a PCN reaction occurring within the last 10 years: no If all of the above answers are "NO", then may proceed with Cephalosporin use.     Histories Past Medical History:  Diagnosis Date  . Anal fissure   . Anemia   . Anxiety   . Arthritis   . Depression   . Fibromyalgia   . GERD (gastroesophageal reflux disease)   . IBS (irritable bowel syndrome)   . Obesity   . Pancreatitis   . Uterine leiomyoma    Past Surgical History:  Procedure Laterality Date  . LIPOSUCTION     Social History   Socioeconomic History  . Marital status: Single    Spouse name: Not on file  . Number of children: 0  . Years of education: Not on file  . Highest education level: Bachelor's degree (e.g., BA, AB, BS)  Occupational History  . Occupation: unemployed  Tobacco Use  . Smoking status: Never Smoker  . Smokeless tobacco: Never Used  . Tobacco comment: never used tobacco  Substance and Sexual Activity  . Alcohol use: No  Alcohol/week: 0.0 standard drinks  . Drug use: No  . Sexual activity: Not on file  Other Topics Concern  . Not on file  Social History Narrative   Single, lives alone. Rarely drinks caffeine. No regular exercise.   Social Determinants of Health   Financial Resource Strain:   . Difficulty of Paying Living Expenses: Not on file  Food Insecurity:   . Worried About Charity fundraiser in the Last Year: Not on file  . Ran Out of Food in the Last Year: Not on file  Transportation Needs:   . Lack of Transportation (Medical): Not on file  . Lack of Transportation (Non-Medical): Not on file  Physical Activity:   . Days of Exercise per Week: Not on file  . Minutes of Exercise per Session: Not on file  Stress:   . Feeling of Stress : Not on file  Social Connections:   . Frequency of Communication with Friends and  Family: Not on file  . Frequency of Social Gatherings with Friends and Family: Not on file  . Attends Religious Services: Not on file  . Active Member of Clubs or Organizations: Not on file  . Attends Archivist Meetings: Not on file  . Marital Status: Not on file  Intimate Partner Violence:   . Fear of Current or Ex-Partner: Not on file  . Emotionally Abused: Not on file  . Physically Abused: Not on file  . Sexually Abused: Not on file   Family History  Problem Relation Age of Onset  . Hypertension Mother   . Diabetes Mother   . Hyperlipidemia Mother   . Heart disease Mother   . Kidney disease Mother        HD  . Colon cancer Mother        Possible history of colon cancer per patient report  . Cancer Mother        Possible history of colon cancer per patient report  . Cancer Brother        mouth and throat  . Diabetes Brother   . Esophageal cancer Neg Hx   . Inflammatory bowel disease Neg Hx   . Liver disease Neg Hx   . Pancreatic cancer Neg Hx   . Stomach cancer Neg Hx    I have reviewed her medical, social, and family history in detail and updated the electronic medical record as necessary.    PHYSICAL EXAMINATION  BP 106/70   Pulse (!) 101   Ht 5' (1.524 m)   Wt 179 lb (81.2 kg)   BMI 34.96 kg/m  Wt Readings from Last 3 Encounters:  01/10/20 179 lb (81.2 kg)  10/10/19 176 lb (79.8 kg)  06/20/19 175 lb (79.4 kg)  GEN: NAD, appears stated age, doesn't appear chronically ill PSYCH: Cooperative, without pressured speech EYE: Conjunctivae pink, sclerae anicteric ENT: MMM, without oral ulcers, no erythema or exudates noted CV: RR without R/Gs  RESP: CTAB posteriorly, without wheezing GI: NABS, soft, protrusion and palpation of potential fibroids in the suprapubic region versus other nodularity was present, tenderness to palpation throughout the abdomen upon deep palpation, nondistended, no rebound or guarding, no hepatosplenomegaly GU: DRE deferred by  patient MSK/EXT: No lower extremity edema SKIN: No jaundice NEURO:  Alert & Oriented x 3, no focal deficits   REVIEW OF DATA  I reviewed the following data at the time of this encounter:  GI Procedures and Studies  No relevant studies to review  Laboratory Studies  Reviewed  those in epic  Imaging Studies  June 2015 CT abdomen pelvis with contrast IMPRESSION:  1. Negative CT of the thorax  2. Stable abdominal adenopathy.  3. Leiomyomatous involvement of the uterus.   May 2016 PET/CT IMPRESSION: 1. No appreciable change in size of small lymph nodes in the upper abdomen with mild metabolic activity. The size of these nodes and the minimal metabolic activity does not favor a lymphoproliferative process and are likely reactive in nature. 2. Leiomyomas uterus.   ASSESSMENT  Ms. Waldron is a 39 y.o. female with a pmh significant for anxiety/depression, fibromyalgia, arthritis, obesity, fibroids, IDA (on replacement), ?Possible family history of colon cancer (mother), ?IBS-C, GERD, chronic constipation, hemorrhoids, hemorrhoids, ?prior anal fissure.  The patient is seen today for evaluation and management of:  1. Generalized abdominal pain   2. Chronic constipation   3. Rectal bleeding   4. Dark stools   5. Gastroesophageal reflux disease, unspecified whether esophagitis present   6. Bloating symptom   7. Lymph nodes enlarged    Patient is hemodynamically stable.  Clinically, she has had symptoms for many years.  The patient likely has IBS-C first versus CIC.  She certainly may be at risk of a rectocele or vaginocele based on her years of constipation and what she describes.  With the episodes of rectal bleeding as well as tarry stools me to further define her anatomy and ensure that we rule out peptic ulcer disease as well as inflammatory bowel diseases but I suspect the rectal bleeding is likely hemorrhoidal in nature and I am not completely convinced about the etiology of the  tarry stools.  But with a history of prior "ulcers of the GI tract and GERD even though it is well controlled" it is reasonable for [place] to evaluate with both a upper and lower endoscopy.  We will get some laboratory evaluation as well to better define any sort of metabolic etiologies for her symptoms.  Due to the palpation of what likely is fibroids in the suprapubic region, but no imaging in years, I am going to move forward with a CT of the abdomen and pelvis to better define her anatomy and this will also allow [place] to relook at the previously noted lymph nodes.  If they are still present and unchanged in size then not sure anything further will have to be done but that is something we can talk with hematology about in the future if necessary.  With the patient's possible family history of colon cancer in her mother is reasonable for [place] also proceed with a colonoscopy as noted above.  She will continue her current PPI dosing for now and we will only make adjustments if necessary.  We will give her some fiber supplementation as well as samples for Linzess 145 mcg in effort of trying to see if this will aid in the patient's symptoms.  We went over toileting techniques as well and these notes will be in the patient instructions as well.  We will see how she does and see her follow-up.  The risks and benefits of endoscopic evaluation were discussed with the patient; these include but are not limited to the risk of perforation, infection, bleeding, missed lesions, lack of diagnosis, severe illness requiring hospitalization, as well as anesthesia and sedation related illnesses.  The patient is agreeable to proceed.  All patient questions were answered to the best of my ability, and the patient agrees to the aforementioned plan of action with follow-up as indicated.  PLAN  Laboratories as outlined below Diagnostics CT abdomen/pelvis to better define anatomy and the findings of potential for fibroids in suprapubic  region and follow-up of previous lymphadenopathy Stool studies as outlined below due to concern of patient for possible tapeworm Continue PPI 20 mg daily for now Diagnostic endoscopy (esophageal/gastric/duodenal biopsies to be obtained) Diagnostic colonoscopy (rule out IBD and biopsies to be obtained) Initiate FiberCon once daily 64 to 80 ounces of fluid per day Toileting technique described and discussed Linzess 145 mcg samples to be initiated in trial and we can adjust as necessary and put in a prescription if she does well   Orders Placed This Encounter  Procedures  . Ova and parasite examination  . CT Abdomen Pelvis W Contrast  . CBC  . Comp Met (CMET)  . TSH  . Sedimentation rate  . CRP High sensitivity  . Tissue transglutaminase, IgA  . INR/PT  . Gamma GT  . IgA    New Prescriptions   NA SULFATE-K SULFATE-MG SULF (SUPREP BOWEL PREP KIT) 17.5-3.13-1.6 GM/177ML SOLN    Take 1 kit by mouth as directed. For colonoscopy prep   Modified Medications   No medications on file    Planned Follow Up No follow-ups on file.   Total Time in Face-to-Face and in Coordination of Care for patient including independent/personal interpretation/review of prior testing, medical history, examination, medication adjustment, communicating results with the patient directly, and documentation with the EHR is 55 minutes.   Justice Britain, MD [facility] Office # [phone]

## 2020-01-13 ENCOUNTER — Encounter: Payer: Self-pay | Admitting: Gastroenterology

## 2020-01-24 ENCOUNTER — Ambulatory Visit (HOSPITAL_COMMUNITY)
Admission: RE | Admit: 2020-01-24 | Discharge: 2020-01-24 | Disposition: A | Discharge status: Home / Self Care | Payer: Medicare Other | Source: Ambulatory Visit | Attending: Gastroenterology | Admitting: Gastroenterology

## 2020-01-24 ENCOUNTER — Other Ambulatory Visit: Payer: Self-pay

## 2020-01-24 ENCOUNTER — Encounter (HOSPITAL_COMMUNITY): Payer: Self-pay

## 2020-01-24 DIAGNOSIS — R1084 Generalized abdominal pain: Secondary | ICD-10-CM | POA: Diagnosis present

## 2020-01-24 DIAGNOSIS — R195 Other fecal abnormalities: Secondary | ICD-10-CM | POA: Insufficient documentation

## 2020-01-24 DIAGNOSIS — K5909 Other constipation: Secondary | ICD-10-CM

## 2020-01-24 DIAGNOSIS — K625 Hemorrhage of anus and rectum: Secondary | ICD-10-CM

## 2020-01-24 DIAGNOSIS — R599 Enlarged lymph nodes, unspecified: Secondary | ICD-10-CM | POA: Insufficient documentation

## 2020-01-24 MED ORDER — IOHEXOL 300 MG/ML  SOLN
100.0000 mL | Freq: Once | INTRAMUSCULAR | Status: AC | PRN
  Administered 2020-01-24: 100 mL via INTRAVENOUS

## 2020-01-31 ENCOUNTER — Other Ambulatory Visit: Payer: Self-pay | Admitting: Obstetrics and Gynecology

## 2020-01-31 DIAGNOSIS — D259 Leiomyoma of uterus, unspecified: Secondary | ICD-10-CM

## 2020-02-01 ENCOUNTER — Telehealth: Payer: Self-pay | Admitting: Internal Medicine

## 2020-02-01 NOTE — Telephone Encounter (Signed)
    Patient requesting Dr Quay Burow look at [place] results from 12/2 Should follow up be scheduled or additional testing .

## 2020-02-02 NOTE — Telephone Encounter (Signed)
The only follow up needed is for her to see gyn about the fibroids in her uterus.  Everything else looks ok.

## 2020-02-04 NOTE — Telephone Encounter (Signed)
Spoke with patient today. 

## 2020-02-07 ENCOUNTER — Other Ambulatory Visit: Payer: Self-pay

## 2020-02-07 ENCOUNTER — Ambulatory Visit
Admission: RE | Admit: 2020-02-07 | Discharge: 2020-02-07 | Disposition: A | Discharge status: Home / Self Care | Payer: Medicare Other | Source: Ambulatory Visit | Attending: Obstetrics and Gynecology | Admitting: Obstetrics and Gynecology

## 2020-02-07 ENCOUNTER — Encounter: Payer: Self-pay | Admitting: *Deleted

## 2020-02-07 DIAGNOSIS — D259 Leiomyoma of uterus, unspecified: Secondary | ICD-10-CM

## 2020-02-07 HISTORY — PX: IR RADIOLOGIST EVAL & MGMT: IMG5224

## 2020-02-07 NOTE — Consult Note (Signed)
Chief Complaint: Symptomatic uterine fibroids  Referring Physician(s): Grewal,Michelle  History of Present Illness: Stephanie Werner is a 39 y.o. (G0, P0) female with past medical history significant for obesity and pancreatitis who is referred to the interventional radiology clinic for evaluation of symptomatic uterine fibroids.  Patient states that she has been dealing with fibroids for the past 15 years.  She states that her primarily primary related complaint is regards to excessive menorrhagia.  Patient states that her cycle has remained regular, the first several days of her cycle are associated with excessive menorrhagia necessitating the changing of high absorbent pads every 30 minutes.  She reports previous history of passing clots and occasional tissue though nothing recently.  Patient denies intraprocedural spotting.  Patient reports mild pelvic discomfort at the time of her cycle though is otherwise without bulk related complaint.  Specifically, no back pain, dysuria or urinary frequency.  Patient denies any perimenopausal symptoms.  While currently not actively pursuing pregnancy, patient does wish to maintain her uterus as to not exclude the possibility of becoming pregnant in the future.   Past Medical History:  Diagnosis Date   Anal fissure    Anemia    Anxiety    Arthritis    Depression    Fibromyalgia    GERD (gastroesophageal reflux disease)    IBS (irritable bowel syndrome)    Obesity    Pancreatitis    Uterine leiomyoma     Past Surgical History:  Procedure Laterality Date   LIPOSUCTION      Allergies: Penicillins  Medications: Prior to Admission medications   Medication Sig Start Date End Date Taking? Authorizing Provider  albuterol (VENTOLIN HFA) 108 (90 Base) MCG/ACT inhaler INHALE 2 PUFFS BY MOUTH EVERY 6 HOURS AS NEEDED FOR WHEEZING AND FOR SHORTNESS OF BREATH 10/11/19   Burns, Claudina Lick, MD  amphetamine-dextroamphetamine  (ADDERALL) 20 MG tablet Take 20 mg by mouth 2 (two) times daily. 07/31/16   [provider]  buPROPion (WELLBUTRIN XL) 300 MG 24 hr tablet Take 300 mg by mouth every morning. 07/12/19   [provider]  cetirizine (ZYRTEC) 10 MG tablet Take 1 tablet (10 mg total) by mouth daily. Need office visit for more refills 11/11/17   Binnie Rail, MD  Cholecalciferol (VITAMIN D-3) 5000 units TABS Taking daily 07/16/16   Burns, Claudina Lick, MD  Fe Cbn-Fe Gluc-FA-B12-C-DSS (FERRALET 90) 90-1 MG TABS Take 1 tablet by mouth daily. 07/09/16   [provider]  fluticasone (FLONASE) 50 MCG/ACT nasal spray Place 2 sprays into both nostrils daily. 03/21/19   Binnie Rail, MD  mupirocin ointment (BACTROBAN) 2 % APPLY  OINTMENT TO EACH NOSTRIL TWICE DAILY 10/23/19   Burns, Claudina Lick, MD  Na Sulfate-K Sulfate-Mg Sulf (SUPREP BOWEL PREP KIT) 17.5-3.13-1.6 GM/177ML SOLN Take 1 kit by mouth as directed. For colonoscopy prep 01/10/20   Mansouraty, Telford Nab., MD  norethindrone (MICRONOR) 0.35 MG tablet Take 1 tablet by mouth daily. 08/27/19   [provider]  Omega-3 Fatty Acids (FISH OIL) 1200 MG CAPS Taking daily 05/28/15   Binnie Rail, MD  omeprazole (PRILOSEC) 20 MG capsule Take 1 capsule (20 mg total) by mouth as needed. 03/21/19   Binnie Rail, MD  sodium chloride (OCEAN) 0.65 % SOLN nasal spray Place 1 spray into both nostrils as needed for congestion. 07/09/16   Nche, Charlene Brooke, NP     Family History  Problem Relation Age of Onset   Hypertension Mother  Diabetes Mother    Hyperlipidemia Mother    Heart disease Mother    Kidney disease Mother        HD   Colon cancer Mother        Possible history of colon cancer per patient report   Cancer Mother        Possible history of colon cancer per patient report   Cancer Brother        mouth and throat   Diabetes Brother    Esophageal cancer Neg Hx    Inflammatory bowel disease Neg Hx    Liver disease Neg Hx     Pancreatic cancer Neg Hx    Stomach cancer Neg Hx     Social History   Socioeconomic History   Marital status: Single    Spouse name: Not on file   Number of children: 0   Years of education: Not on file   Highest education level: Bachelor's degree (e.g., BA, AB, BS)  Occupational History   Occupation: unemployed  Tobacco Use   Smoking status: Never Smoker   Smokeless tobacco: Never Used   Tobacco comment: never used tobacco  Substance and Sexual Activity   Alcohol use: No    Alcohol/week: 0.0 standard drinks   Drug use: No   Sexual activity: Not on file  Other Topics Concern   Not on file  Social History Narrative   Single, lives alone. Rarely drinks caffeine. No regular exercise.   Social Determinants of Health   Financial Resource Strain: Not on file  Food Insecurity: Not on file  Transportation Needs: Not on file  Physical Activity: Not on file  Stress: Not on file  Social Connections: Not on file    ECOG Status: 0 - Asymptomatic  Review of Systems  Review of Systems: A 12 point ROS discussed and pertinent positives are indicated in the HPI above.  All other systems are negative.  Physical Exam No direct physical exam was performed (except for noted visual exam findings with Video Visits).   Vital Signs: There were no vitals taken for this visit.  Imaging: CT Abdomen Pelvis W Contrast  Result Date: 01/24/2020 CLINICAL DATA:  Generalized abdominal pain. Chronic constipation. Rectal bleeding with dark stools. History of chronic abdominal lymphadenopathy. History of fibromyalgia. EXAM: CT ABDOMEN AND PELVIS WITH CONTRAST TECHNIQUE: Multidetector CT imaging of the abdomen and pelvis was performed using the standard protocol following bolus administration of intravenous contrast. CONTRAST:  148m OMNIPAQUE IOHEXOL 300 MG/ML  SOLN COMPARISON:  07/09/2014 PET-CT. 08/10/2013 CT chest, abdomen and pelvis. FINDINGS: Lower chest: No significant pulmonary  nodules or acute consolidative airspace disease. Hepatobiliary: Normal liver size. No liver mass. Normal gallbladder with no radiopaque cholelithiasis. No biliary ductal dilatation. Pancreas: Normal, with no mass or duct dilation. Spleen: Normal size. No mass. Adrenals/Urinary Tract: Normal adrenals. No renal masses. Mild fullness of the central right renal collecting system without overt hydronephrosis. No left hydronephrosis. Normal bladder. Stomach/Bowel: Normal non-distended stomach. Normal caliber small bowel with no small bowel wall thickening. Normal appendix. Oral contrast transits to the rectum. Scattered mild colonic diverticulosis with no large bowel wall thickening or significant pericolonic fat stranding. Mild-to-moderate diffuse colorectal stool. Vascular/Lymphatic: Normal caliber abdominal aorta. Patent portal, splenic, hepatic and renal veins. Mild porta hepatis adenopathy up to 1.1 cm (series 2/image 24), unchanged since 07/09/2014 PET-CT, considered benign. No new pathologically enlarged abdominopelvic nodes. Reproductive: Markedly enlarged myomatous uterus measuring 25.4 x 12.6 x 19.5 cm, substantially increased since 07/09/2014 PET-CT study.  Dominant 14.0 cm right uterine body fibroid. Some of the uterine fibroids demonstrate calcific degeneration. No adnexal masses. Other: No pneumoperitoneum, ascites or focal fluid collection. Musculoskeletal: No aggressive appearing focal osseous lesions. IMPRESSION: 1. Markedly enlarged myomatous uterus, substantially increased since 07/09/2014 PET-CT study. GYN consultation suggested. 2. No acute abnormality. No evidence of bowel obstruction or acute bowel inflammation. Mild colonic diverticulosis, with no evidence of acute diverticulitis. Electronically Signed   By: Ilona Sorrel M.D.   On: 01/24/2020 12:19    Labs:  CBC: Recent Labs    03/21/19 1048 01/10/20 1040  WBC 5.7 5.6  HGB 13.6 13.9  HCT 40.7 40.5  PLT 372.0 365.0    COAGS: Recent  Labs    01/10/20 1040  INR 1.0    BMP: Recent Labs    03/21/19 1048 01/10/20 1040  NA 137 137  K 3.8 4.2  CL 105 104  CO2 25 25  GLUCOSE 85 86  BUN 8 8  CALCIUM 9.9 10.0  CREATININE 0.80 0.73    LIVER FUNCTION TESTS: Recent Labs    03/21/19 1048 01/10/20 1040  BILITOT 0.2 0.2  AST 22 19  ALT 21 23  ALKPHOS 61 62  PROT 8.2 8.5*  ALBUMIN 4.4 4.6    TUMOR MARKERS: No results for input(s): AFPTM, CEA, CA199, CHROMGRNA in the last 8760 hours.  Papsmear - 10/05/2019 - negative  Assessment and Plan:  VERALYN LOPP is a 39 y.o. (G0, P0) female with past medical history significant for obesity and pancreatitis who is referred to the interventional radiology clinic for evaluation of symptomatic uterine fibroids.  She states that her primarily primary related complaint is regards to excessive menorrhagia. Patient reports mild pelvic discomfort at the time of her cycle though is otherwise without bulk related complaint.  Specifically, no back pain, dysuria or urinary frequency.  Prolonged conversations were held with the patient regarding potential treatment options for her symptomatic uterine fibroids including continued conservative management and hysterectomy.  At the present time, the patient wishes to undergo intervention though wishes to avoid a hysterectomy as to maintain potential for future fertility.  As such, prolonged conversations were held with the patient regarding the benefits and risks (including but not limited to bleeding, vessel injury, nontarget embolization, infection, contrast and radiation exposure) of uterine fibroid embolization.  I explained that recommendations are to practice contraceptive measures for at least 1 year following uterine embolization.  Following this prolonged and detailed conversation, the patient wishes to pursue this procedure.  Review of CT scan of the abdomen and pelvis performed 01/24/2020 demonstrates a markedly enlarged  myomatous uterus.  While the majority of the fibroids appear to demonstrate heterogeneous enhancement, there is one fibroid within the dome of the uterus which is peripherally calcified.  Regardless, will proceed with obtaining a contrast enhanced pelvic MRI to better delineate the fibroid size and burden and to ensure fibroid enhancement.   We will also coordinate to have an endometrial biopsy performed.  Note, pap smear was performed on 10/05/2019 with negative results.  Ultimately, the uterine fibroid embolization will be performed at [facility] and will entail an overnight admission for continued observation and PCA usage.  The patient knows to call the interventional radiology clinic with any interval questions or concerns.  Plan: - Obtain contrast-enhanced pelvic MRI and endometrial biopsy. - Upon completion of above, schedule uterine fibroid embolization to be performed at [facility] with an overnight admission for continued observation and PCA usage, ideally  at a time when the patient is not on her cycle.  Thank you for this interesting consult.  I greatly enjoyed meeting Stephanie Werner and look forward to participating in their care.  A copy of this report was sent to the requesting provider on this date.  Electronically Signed: Sandi Mariscal 02/07/2020, 9:05 AM   I spent a total of 30 Minutes in remote  clinical consultation, greater than 50% of which was counseling/coordinating care for symptomatic uterine fibroids.    Visit type: Audio only (telephone). Audio (no video) only due to patient's lack of internet/smartphone capability. Alternative for in-person consultation at [facility], [address], [place], [place]. This visit type was conducted due to national recommendations for restrictions regarding the COVID-19 Pandemic (e.g. social distancing).  This format is felt to be most appropriate for this patient at this time.  All issues noted in this  document were discussed and addressed.

## 2020-02-08 ENCOUNTER — Other Ambulatory Visit: Payer: Self-pay | Admitting: Interventional Radiology

## 2020-02-08 DIAGNOSIS — D250 Submucous leiomyoma of uterus: Secondary | ICD-10-CM

## 2020-03-14 ENCOUNTER — Encounter: Payer: Medicare Other | Admitting: Gastroenterology

## 2020-04-11 ENCOUNTER — Encounter: Payer: Self-pay | Admitting: Internal Medicine

## 2020-04-11 ENCOUNTER — Other Ambulatory Visit: Payer: Self-pay

## 2020-04-11 ENCOUNTER — Ambulatory Visit (INDEPENDENT_AMBULATORY_CARE_PROVIDER_SITE_OTHER): Payer: Medicare Other | Admitting: Internal Medicine

## 2020-04-11 VITALS — BP 120/76 | HR 96 | Temp 98.6°F | Ht 60.0 in | Wt 179.0 lb

## 2020-04-11 DIAGNOSIS — L0291 Cutaneous abscess, unspecified: Secondary | ICD-10-CM

## 2020-04-11 DIAGNOSIS — J3489 Other specified disorders of nose and nasal sinuses: Secondary | ICD-10-CM

## 2020-04-11 DIAGNOSIS — J309 Allergic rhinitis, unspecified: Secondary | ICD-10-CM | POA: Diagnosis not present

## 2020-04-11 MED ORDER — MUPIROCIN CALCIUM 2 % EX CREA: 1.0000 "application " | TOPICAL_CREAM | Freq: Two times a day (BID) | CUTANEOUS | 0 refills | Status: DC

## 2020-04-11 MED ORDER — DOXYCYCLINE HYCLATE 100 MG PO TABS
100.0000 mg | ORAL_TABLET | Freq: Two times a day (BID) | ORAL | 0 refills | Status: DC
Start: 2020-04-11 — End: 2020-04-28

## 2020-04-11 NOTE — Progress Notes (Signed)
Patient ID: Stephanie Werner, female   DOB: [birth year], 40 y.o.   MRN: [id number]        Chief Complaint: multiple skin lesions small and larger c/w recurring abscess       HPI:  Stephanie Werner is a 40 y.o. female here with c/o above ongoing over 1 yr, currently with small tender lesions to left pudendum, left antecubital area, right upper medial quadrant breast and nasal sore.  No prior hx of MRSA known but keeps getting these lesions, which tend to drain when get large enough and tender   Denies fever, chills   Did also have a larger abscess to left breast earlier this yr now resolved.  Pt denies chest pain, increased sob or doe, wheezing, orthopnea, PND, increased LE swelling, palpitations, dizziness or syncope.   Pt denies polydipsia, polyuria, Denies focal neuro s/s.    Has hx of acne but these lesions are different.  Does have several wks ongoing nasal allergy symptoms with clearish congestion, itch and sneezing, without fever, pain, ST, cough, swelling or wheezing  Wt Readings from Last 3 Encounters:  04/11/20 179 lb (81.2 kg)  01/10/20 179 lb (81.2 kg)  10/10/19 176 lb (79.8 kg)   BP Readings from Last 3 Encounters:  04/11/20 120/76  01/10/20 106/70  10/10/19 130/80         Past Medical History:  Diagnosis Date  . Anal fissure   . Anemia   . Anxiety   . Arthritis   . Depression   . Fibromyalgia   . GERD (gastroesophageal reflux disease)   . IBS (irritable bowel syndrome)   . Obesity   . Pancreatitis   . Uterine leiomyoma    Past Surgical History:  Procedure Laterality Date  . IR RADIOLOGIST EVAL & MGMT  02/07/2020  . LIPOSUCTION      reports that she has never smoked. She has never used smokeless tobacco. She reports that she does not drink alcohol and does not use drugs. family history includes Cancer in her brother and mother; Colon cancer in her mother; Diabetes in her brother and mother; Heart disease in her mother; Hyperlipidemia in her mother; Hypertension in  her mother; Kidney disease in her mother. Allergies  Allergen Reactions  . Penicillins Rash    Has patient had a PCN reaction causing immediate rash, facial/tongue/throat swelling, SOB or lightheadedness with hypotension: No Has patient had a PCN reaction causing severe rash involving mucus membranes or skin necrosis: } Has patient had a PCN reaction that required hospitalization: no Has patient had a PCN reaction occurring within the last 10 years: no If all of the above answers are "NO", then may proceed with Cephalosporin use.    Current Outpatient Medications on File Prior to Visit  Medication Sig Dispense Refill  . albuterol (VENTOLIN HFA) 108 (90 Base) MCG/ACT inhaler INHALE 2 PUFFS BY MOUTH EVERY 6 HOURS AS NEEDED FOR WHEEZING AND FOR SHORTNESS OF BREATH 18 g 2  . amphetamine-dextroamphetamine (ADDERALL) 20 MG tablet Take 20 mg by mouth 2 (two) times daily.  0  . Azelaic Acid 15 % cream azelaic acid 15 % topical gel  APPLY TO THE FACE IN THE EVENING    . buPROPion (WELLBUTRIN XL) 300 MG 24 hr tablet Take 300 mg by mouth every morning.    . cetirizine (ZYRTEC) 10 MG tablet Take 1 tablet (10 mg total) by mouth daily. Need office visit for more refills 30 tablet 0  . Cholecalciferol (VITAMIN D-3) 5000  units TABS Taking daily 30 tablet   . Fe Cbn-Fe Gluc-FA-B12-C-DSS (FERRALET 90) 90-1 MG TABS Take 1 tablet by mouth daily.  3  . fluticasone (FLONASE) 50 MCG/ACT nasal spray Place 2 sprays into both nostrils daily. 16 g 5  . mupirocin ointment (BACTROBAN) 2 % APPLY  OINTMENT TO EACH NOSTRIL TWICE DAILY 22 g 0  . norethindrone (MICRONOR) 0.35 MG tablet Take 1 tablet by mouth daily.    . Omega-3 Fatty Acids (FISH OIL) 1200 MG CAPS Taking daily    . omeprazole (PRILOSEC) 20 MG capsule Take 1 capsule (20 mg total) by mouth as needed. 90 capsule 3  . sodium chloride (OCEAN) 0.65 % SOLN nasal spray Place 1 spray into both nostrils as needed for congestion. 15 mL 0  . tretinoin (RETIN-A) 0.025 %  cream tretinoin 0.025 % topical cream  APPLY A PEA SIZE TO AFFECTED AREAS ON THE FACE FOR ACNE IN THE EVENING AND EVERY OTHER NIGHT    . Fe Cbn-Fe Gluc-FA-B12-C-DSS (FERRALET 90) 90-1 MG TABS Ferralet 90  ; Folic Acid $RemoveBe'1MG'OnRQGekPZ$ ; Vitamin B12  (cyanocobalamin) 12MCG; Vitamin C $RemoveB'120MG'fNncsoeq$ ; Docusate Sodium $RemoveBeforeD'50MG'uYUwfKoococZlF$   1 po qd    . HYDROcodone-acetaminophen (NORCO) 10-325 MG tablet     . Na Sulfate-K Sulfate-Mg Sulf (SUPREP BOWEL PREP KIT) 17.5-3.13-1.6 GM/177ML SOLN Take 1 kit by mouth as directed. For colonoscopy prep (Patient not taking: Reported on 04/11/2020) 354 mL 0   No current facility-administered medications on file prior to visit.        ROS:  All others reviewed and negative.  Objective        PE:  BP 120/76   Pulse 96   Temp 98.6 F (37 C) (Oral)   Ht 5' (1.524 m)   Wt 179 lb (81.2 kg)   SpO2 98%   BMI 34.96 kg/m                 Constitutional: Pt appears in NAD               HENT: Head: NCAT.                Right Ear: External ear normal.                 Left Ear: External ear normal.                Eyes: . Pupils are equal, round, and reactive to light. Conjunctivae and EOM are normal               Nose: without d/c or deformity               Neck: Neck supple. Gross normal ROM               Cardiovascular: Normal rate and regular rhythm.                 Pulmonary/Chest: Effort normal and breath sounds without rales or wheezing.                Abd:  Soft, NT, ND, + BS, no organomegaly; left pudendum with < pea sized subq firm tender abscess remnant, 4 mm lesion noted to right upper medial quad breast, left antecub with postinflammatory hyperpigmentation over a 3 mm subq firm spot               Neurological: Pt is alert. At baseline orientation, motor grossly intact  Skin: Skin is warm. No rashes, no other new lesions, LE edema - none               Psychiatric: Pt behavior is normal without agitation   Micro: none  Cardiac tracings I have personally interpreted today:   none  Pertinent Radiological findings (summarize): none   Lab Results  Component Value Date   WBC 5.6 01/10/2020   HGB 13.9 01/10/2020   HCT 40.5 01/10/2020   PLT 365.0 01/10/2020   GLUCOSE 86 01/10/2020   CHOL 177 07/16/2016   TRIG 164.0 (H) 07/16/2016   HDL 37.50 (L) 07/16/2016   LDLCALC 107 (H) 07/16/2016   ALT 23 01/10/2020   AST 19 01/10/2020   NA 137 01/10/2020   K 4.2 01/10/2020   CL 104 01/10/2020   CREATININE 0.73 01/10/2020   BUN 8 01/10/2020   CO2 25 01/10/2020   TSH 1.69 01/10/2020   INR 1.0 01/10/2020   HGBA1C 5.6 05/28/2015   Assessment/Plan:  Stephanie Werner is a 37 y.o. Black or African American [2] female with  has a past medical history of Anal fissure, Anemia, Anxiety, Arthritis, Depression, Fibromyalgia, GERD (gastroesophageal reflux disease), IBS (irritable bowel syndrome), Obesity, Pancreatitis, and Uterine leiomyoma.  Nasal sore ? MRSA related - for mupirocin empiric asd,  to f/u any worsening symptoms or concerns  Abscess Multiple small lesions in various stages, for doxy course,  to f/u any worsening symptoms or concerns  Allergic rhinitis Ok to restart the flonase asd,  to f/u any worsening symptoms or concerns  Followup: Return if symptoms worsen or fail to improve.  Cathlean Cower, MD 04/11/2020 9:02 PM [facility]

## 2020-04-11 NOTE — Assessment & Plan Note (Signed)
?   MRSA related - for mupirocin empiric asd,  to f/u any worsening symptoms or concerns

## 2020-04-11 NOTE — Patient Instructions (Signed)
Please take all new medication as prescribed - the nose and oral antibiotic  Please continue all other medications as before, and refills have been done if requested.  Please have the pharmacy call with any other refills you may need.  Please keep your appointments with your specialists as you may have planned

## 2020-04-11 NOTE — Assessment & Plan Note (Signed)
Ok to restart the flonase asd,  to f/u any worsening symptoms or concerns

## 2020-04-11 NOTE — Assessment & Plan Note (Signed)
Multiple small lesions in various stages, for doxy course,  to f/u any worsening symptoms or concerns

## 2020-04-27 NOTE — Progress Notes (Signed)
Subjective:    Patient ID: Stephanie Werner, female    DOB: 07-22-1980, 40 y.o.   MRN: [id number]  HPI The patient is here for an acute visit.  She is here for follow up of a staph infection.    She saw Dr Jenny Reichmann on 2/18 for nasal sore - treated with mupirocin and abscess - several small lesions on body - treated with doxycyline.  She took the medications.    The bumps under his skin have not changed.  They are not painful.  She has one on her left forearm, one on her right breast and one on the pubic area.  The one in the pubic area has shrank.  The other two are the same.  They are all new since January.    She also notes acne on her forehead.  She has seen dermatology in the past for this and has tried a couple of different things which has not helped.  Medications and allergies reviewed with patient and updated if appropriate.  Patient Active Problem List   Diagnosis Date Noted  . Skin abnormalities 04/28/2020  . Abscess 04/11/2020  . Dark stools 01/11/2020  . Rectal bleeding 01/11/2020  . Generalized abdominal pain 01/11/2020  . Bloating symptom 01/11/2020  . Chronic constipation 10/10/2019  . Hyperpigmentation 10/10/2019  . Acne 10/10/2019  . Exposure to pesticide 06/20/2019  . Swelling of soft tissue of forearm 06/20/2019  . Chest tightness 03/21/2019  . Nasal sore 02/27/2019  . Allergic rhinitis 10/27/2016  . Vertigo 10/27/2016  . Concussion with no loss of consciousness 09/23/2016  . IDA (iron deficiency anemia) 03/23/2016  . Gastroesophageal reflux disease 05/28/2015  . ADD (attention deficit disorder) 05/28/2015  . Depression 05/28/2015  . Fatigue 05/28/2015  . Joint pain 05/28/2015  . Family history of diabetes mellitus 05/28/2015  . Fibroid 05/28/2015  . Anemia, iron deficiency 05/28/2015  . Obese 05/28/2015  . Lymph nodes enlarged 06/27/2014    Current Outpatient Medications on File Prior to Visit  Medication Sig Dispense Refill  . albuterol (VENTOLIN  HFA) 108 (90 Base) MCG/ACT inhaler INHALE 2 PUFFS BY MOUTH EVERY 6 HOURS AS NEEDED FOR WHEEZING AND FOR SHORTNESS OF BREATH 18 g 2  . amphetamine-dextroamphetamine (ADDERALL) 20 MG tablet Take 20 mg by mouth 2 (two) times daily.  0  . Azelaic Acid 15 % cream azelaic acid 15 % topical gel  APPLY TO THE FACE IN THE EVENING    . buPROPion (WELLBUTRIN XL) 300 MG 24 hr tablet Take 300 mg by mouth every morning.    . cetirizine (ZYRTEC) 10 MG tablet Take 1 tablet (10 mg total) by mouth daily. Need office visit for more refills 30 tablet 0  . Cholecalciferol (VITAMIN D-3) 5000 units TABS Taking daily 30 tablet   . Fe Cbn-Fe Gluc-FA-B12-C-DSS (FERRALET 90) 90-1 MG TABS Take 1 tablet by mouth daily.  3  . Fe Cbn-Fe Gluc-FA-B12-C-DSS (FERRALET 90) 90-1 MG TABS Ferralet 90  ; Folic Acid 1MG; Vitamin B12  (cyanocobalamin) 12MCG; Vitamin C 120MG; Docusate Sodium 50MG  1 po qd    . fluticasone (FLONASE) 50 MCG/ACT nasal spray Place 2 sprays into both nostrils daily. 16 g 5  . HYDROcodone-acetaminophen (NORCO) 10-325 MG tablet     . mupirocin cream (BACTROBAN) 2 % Apply 1 application topically 2 (two) times daily. 15 g 0  . mupirocin ointment (BACTROBAN) 2 % APPLY  OINTMENT TO EACH NOSTRIL TWICE DAILY 22 g 0  . Na Sulfate-K  Sulfate-Mg Sulf (SUPREP BOWEL PREP KIT) 17.5-3.13-1.6 GM/177ML SOLN Take 1 kit by mouth as directed. For colonoscopy prep 354 mL 0  . norethindrone (MICRONOR) 0.35 MG tablet Take 1 tablet by mouth daily.    . Omega-3 Fatty Acids (FISH OIL) 1200 MG CAPS Taking daily    . omeprazole (PRILOSEC) 20 MG capsule Take 1 capsule (20 mg total) by mouth as needed. 90 capsule 3  . sodium chloride (OCEAN) 0.65 % SOLN nasal spray Place 1 spray into both nostrils as needed for congestion. 15 mL 0  . tretinoin (RETIN-A) 0.025 % cream tretinoin 0.025 % topical cream  APPLY A PEA SIZE TO AFFECTED AREAS ON THE FACE FOR ACNE IN THE EVENING AND EVERY OTHER NIGHT     No current facility-administered  medications on file prior to visit.    Past Medical History:  Diagnosis Date  . Anal fissure   . Anemia   . Anxiety   . Arthritis   . Depression   . Fibromyalgia   . GERD (gastroesophageal reflux disease)   . IBS (irritable bowel syndrome)   . Obesity   . Pancreatitis   . Uterine leiomyoma     Past Surgical History:  Procedure Laterality Date  . IR RADIOLOGIST EVAL & MGMT  02/07/2020  . LIPOSUCTION      Social History   Socioeconomic History  . Marital status: Single    Spouse name: Not on file  . Number of children: 0  . Years of education: Not on file  . Highest education level: Bachelor's degree (e.g., BA, AB, BS)  Occupational History  . Occupation: unemployed  Tobacco Use  . Smoking status: Never Smoker  . Smokeless tobacco: Never Used  . Tobacco comment: never used tobacco  Substance and Sexual Activity  . Alcohol use: No    Alcohol/week: 0.0 standard drinks  . Drug use: No  . Sexual activity: Not on file  Other Topics Concern  . Not on file  Social History Narrative   Single, lives alone. Rarely drinks caffeine. No regular exercise.   Social Determinants of Health   Financial Resource Strain: Not on file  Food Insecurity: Not on file  Transportation Needs: Not on file  Physical Activity: Not on file  Stress: Not on file  Social Connections: Not on file    Family History  Problem Relation Age of Onset  . Hypertension Mother   . Diabetes Mother   . Hyperlipidemia Mother   . Heart disease Mother   . Kidney disease Mother        HD  . Colon cancer Mother        Possible history of colon cancer per patient report  . Cancer Mother        Possible history of colon cancer per patient report  . Cancer Brother        mouth and throat  . Diabetes Brother   . Esophageal cancer Neg Hx   . Inflammatory bowel disease Neg Hx   . Liver disease Neg Hx   . Pancreatic cancer Neg Hx   . Stomach cancer Neg Hx     Review of Systems     Objective:    Vitals:   04/28/20 0855  BP: 114/72  Pulse: 99  Temp: 98.4 F (36.9 C)  SpO2: 99%   BP Readings from Last 3 Encounters:  04/28/20 114/72  04/11/20 120/76  01/10/20 106/70   Wt Readings from Last 3 Encounters:  04/28/20 180 lb (81.6  kg)  04/11/20 179 lb (81.2 kg)  01/10/20 179 lb (81.2 kg)   Body mass index is 35.15 kg/m.   Physical Exam Constitutional:      General: She is not in acute distress.    Appearance: Normal appearance. She is not ill-appearing.  HENT:     Head: Normocephalic and atraumatic.  Skin:    General: Skin is warm and dry.     Comments: Acne-forehead, hyperpigmented area left anterior forearm with a small palpable cyst versus lipoma underneath, ingrown hair right breast  Neurological:     Mental Status: She is alert.            Assessment & Plan:    See Problem List for Assessment and Plan of chronic medical problems.    This visit occurred during the SARS-CoV-2 public health emergency.  Safety protocols were in place, including screening questions prior to the visit, additional usage of staff PPE, and extensive cleaning of exam room while observing appropriate contact time as indicated for disinfecting solutions.

## 2020-04-28 ENCOUNTER — Encounter: Payer: Self-pay | Admitting: Internal Medicine

## 2020-04-28 ENCOUNTER — Ambulatory Visit (INDEPENDENT_AMBULATORY_CARE_PROVIDER_SITE_OTHER): Payer: Medicare Other | Admitting: Internal Medicine

## 2020-04-28 ENCOUNTER — Other Ambulatory Visit: Payer: Self-pay

## 2020-04-28 VITALS — BP 114/72 | HR 99 | Temp 98.4°F | Ht 60.0 in | Wt 180.0 lb

## 2020-04-28 DIAGNOSIS — L989 Disorder of the skin and subcutaneous tissue, unspecified: Secondary | ICD-10-CM | POA: Diagnosis not present

## 2020-04-28 NOTE — Assessment & Plan Note (Signed)
Acute She has a few skin abnormalities and would benefit from seeing dermatology-referral ordered She will discuss with them her facial acne, ingrown hairs and review some moles to see if they would warrant removal No additional antibiotics necessary-no signs of infection

## 2020-04-28 NOTE — Patient Instructions (Addendum)
  Medications changes include :  none    A referral was ordered for [facility] dermatology.       Someone from their office will call you to schedule an appointment.

## 2020-10-30 ENCOUNTER — Telehealth: Payer: Self-pay | Admitting: Internal Medicine

## 2020-10-30 NOTE — Telephone Encounter (Signed)
VM full, unable to leave a message for patient to call me back at [phone] to schedule Medicare Annual Wellness Visit   No hx of AWV eligible as of 01/23/20  Please schedule at anytime with [facility] Advisor if patient calls the office back.    40 Minutes appointment   Any questions, please call me at [phone]

## 2020-12-04 ENCOUNTER — Telehealth: Payer: Self-pay | Admitting: Internal Medicine

## 2020-12-04 MED ORDER — SCOPOLAMINE 1 MG/3DAYS TD PT72: 1.0000 | MEDICATED_PATCH | TRANSDERMAL | 0 refills | Status: DC

## 2020-12-04 NOTE — Telephone Encounter (Signed)
Patient is going on a cruise on 10.24.22, would like a motion sickness pill or patch for the trip  Needs a script sent to the pharmacy  [facility], [place]. Phone:  [phone]  Fax:  [phone]      Please follow-up with the patient

## 2020-12-04 NOTE — Telephone Encounter (Signed)
Left message for patient

## 2020-12-04 NOTE — Telephone Encounter (Signed)
Sent patch.   Other option is otc dramamine

## 2021-03-24 IMAGING — CT CT ABD-PELV W/ CM
2 of 5 series · 16 of 46 positions shown, 18 images · IV contrast (OMNIPAQUE)
Comparison: 07/09/2014 PET-CT. 08/10/2013 CT chest, abdomen and
pelvis.

CLINICAL DATA: Generalized abdominal pain. Chronic constipation.
Rectal bleeding with dark stools. History of chronic abdominal
lymphadenopathy. History of fibromyalgia.

EXAM:
CT ABDOMEN AND PELVIS WITH CONTRAST
TECHNIQUE: Multidetector CT imaging of the abdomen and pelvis was performed
using the standard protocol following bolus administration of
intravenous contrast.
CONTRAST:  100mL OMNIPAQUE IOHEXOL 300 MG/ML  SOLN

[Series 2: axial st · axial · 0.72mm/px · z∈[-633,-233]mm · 13 of 94 slices shown, 15 images]
[im 7/94  soft-tissue]
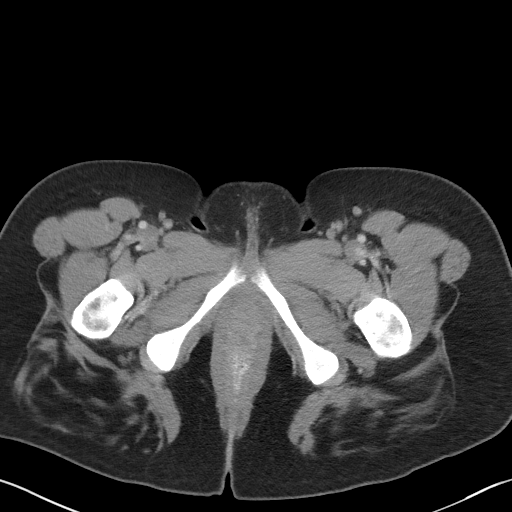
[im 7/94  bone]
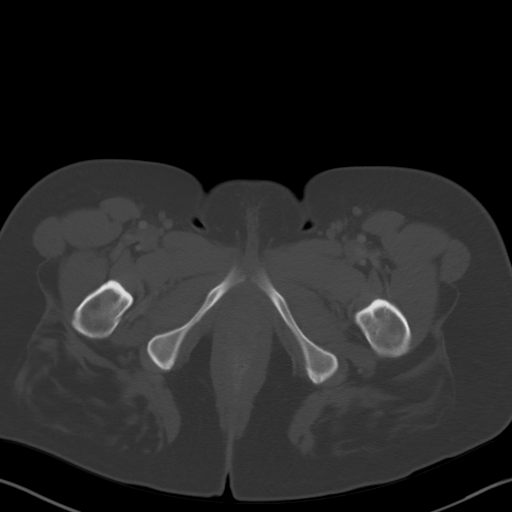
[im 13/94  soft-tissue]
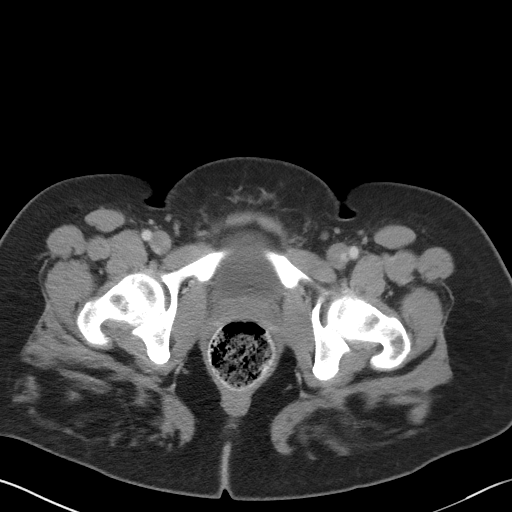
[im 19/94  soft-tissue]
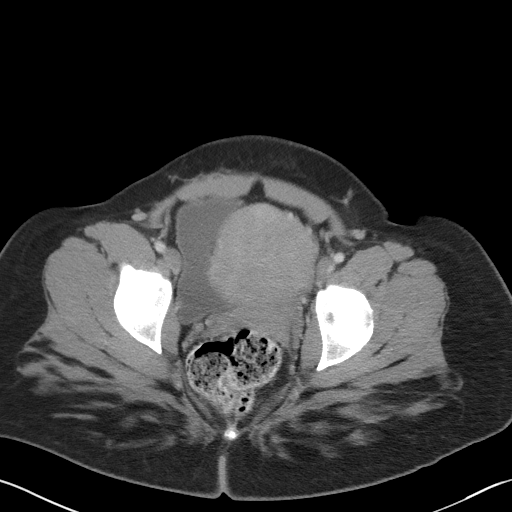
[im 25/94  soft-tissue]
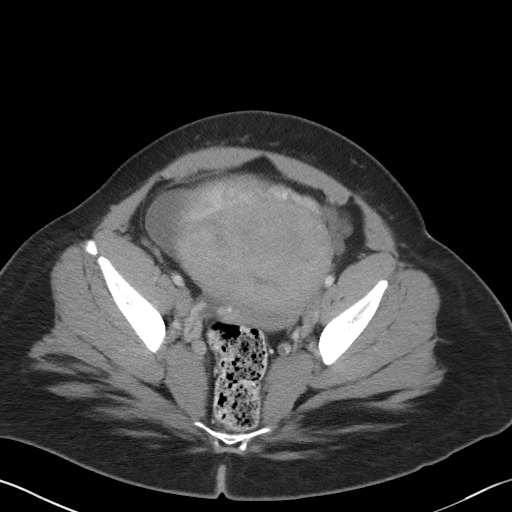
[im 32/94  soft-tissue]
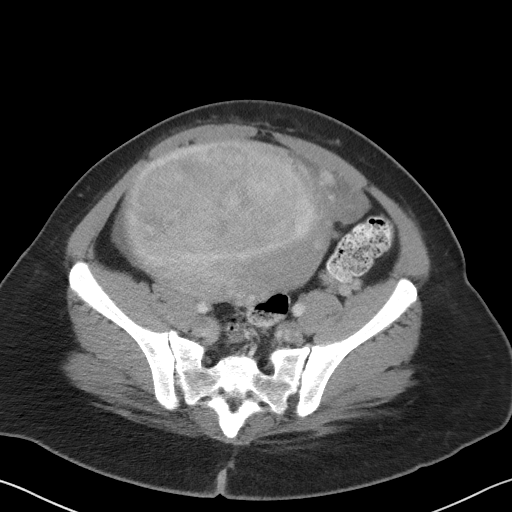
[im 38/94  soft-tissue]
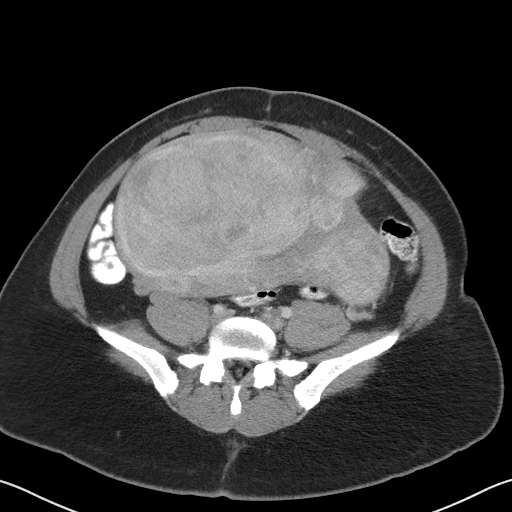
[im 50/94  soft-tissue]
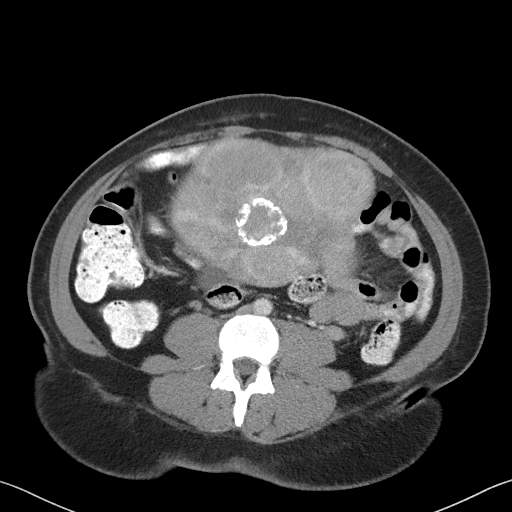
[im 56/94  soft-tissue]
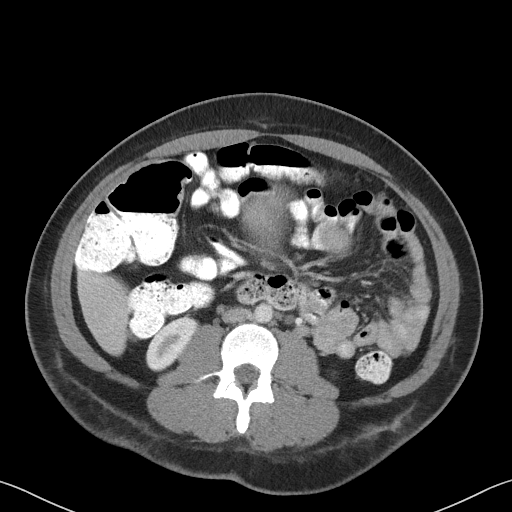
[im 63/94  soft-tissue]
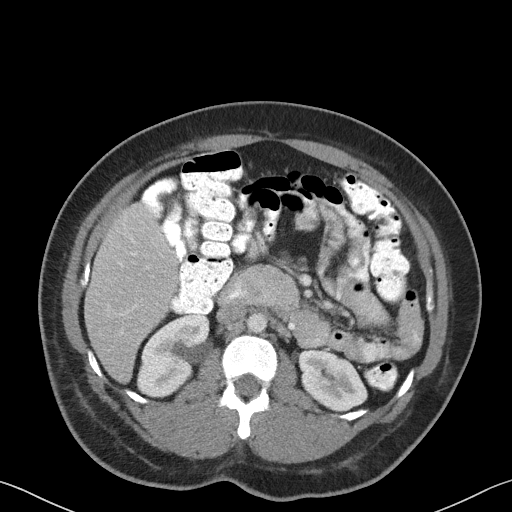
[im 63/94  bone]
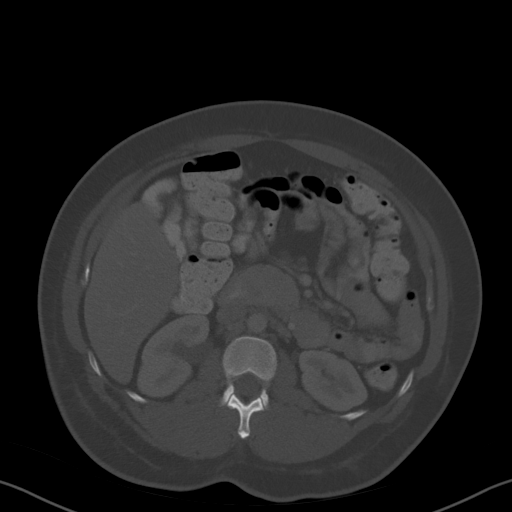
[im 69/94  soft-tissue]
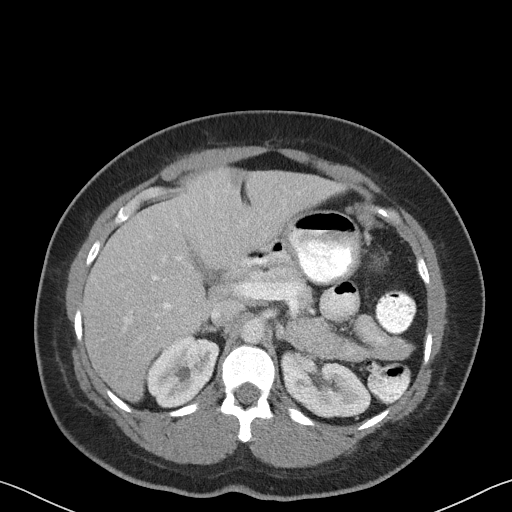
[im 75/94  soft-tissue]
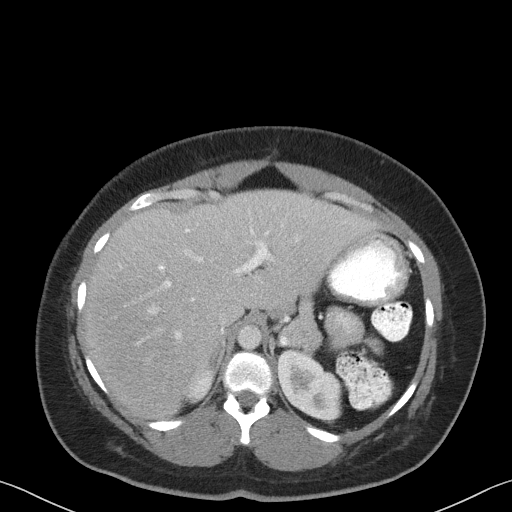
[im 81/94  soft-tissue]
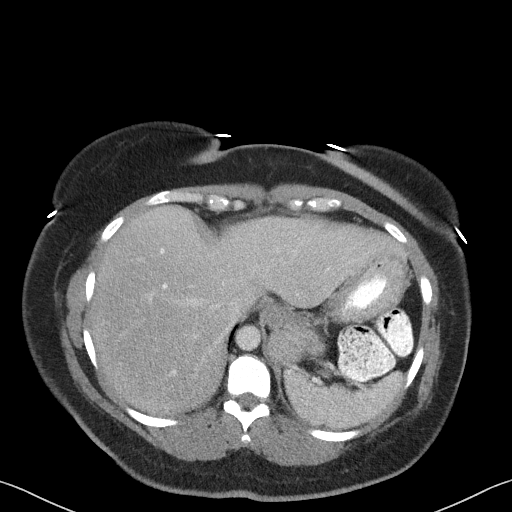
[im 87/94  soft-tissue]
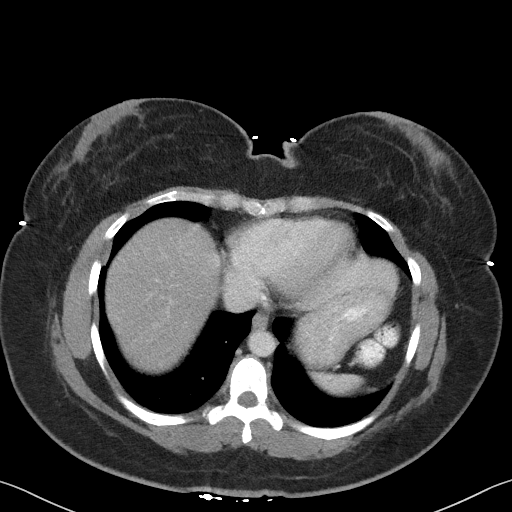

[Series 5: coronal st · coronal · 0.83mm/px · 3 of 89 slices shown]
[im 30/89  soft-tissue]
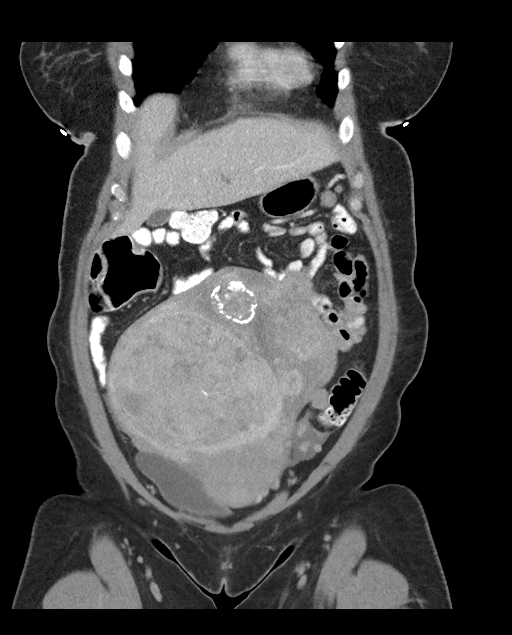
[im 40/89  soft-tissue]
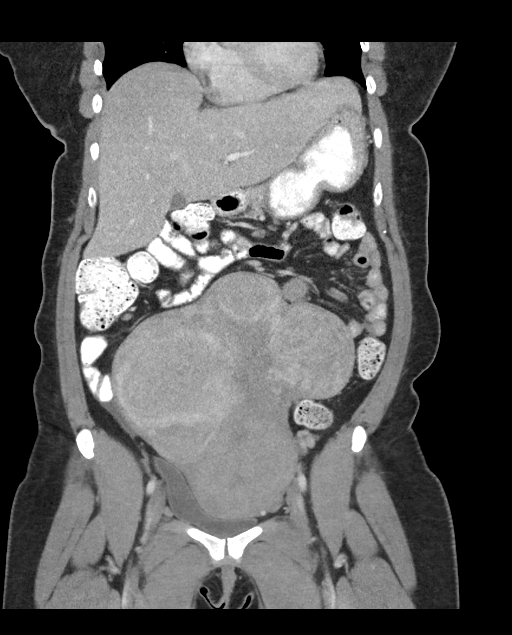
[im 49/89  soft-tissue]
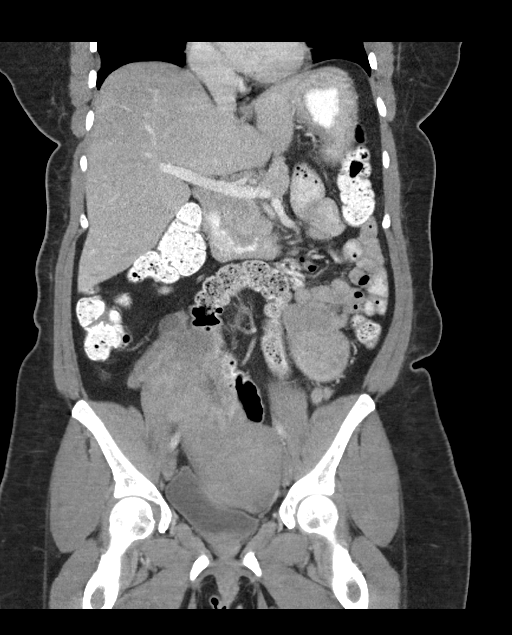

[16 of 46 positions shown; findings below may reference images not displayed]

FINDINGS: Lower chest: No significant pulmonary nodules or acute consolidative
airspace disease.

Hepatobiliary: Normal liver size. No liver mass. Normal gallbladder
with no radiopaque cholelithiasis. No biliary ductal dilatation.

Pancreas: Normal, with no mass or duct dilation.

Spleen: Normal size. No mass.

Adrenals/Urinary Tract: Normal adrenals. No renal masses. Mild
fullness of the central right renal collecting system without overt
hydronephrosis. No left hydronephrosis. Normal bladder.

Stomach/Bowel: Normal non-distended stomach. Normal caliber small
bowel with no small bowel wall thickening. Normal appendix. Oral
contrast transits to the rectum. Scattered mild colonic
diverticulosis with no large bowel wall thickening or significant
pericolonic fat stranding. Mild-to-moderate diffuse colorectal
stool.

Vascular/Lymphatic: Normal caliber abdominal aorta. Patent portal,
splenic, hepatic and renal veins. Mild porta hepatis adenopathy up
to 1.1 cm (series 2/image 24), unchanged since 07/09/2014 PET-CT,
considered benign. No new pathologically enlarged abdominopelvic
nodes.

Reproductive: Markedly enlarged myomatous uterus measuring 25.4 x
12.6 x 19.5 cm, substantially increased since 07/09/2014 PET-CT
study. Dominant 14.0 cm right uterine body fibroid. Some of the
uterine fibroids demonstrate calcific degeneration. No adnexal
masses.

Other: No pneumoperitoneum, ascites or focal fluid collection.

Musculoskeletal: No aggressive appearing focal osseous lesions.
IMPRESSION: 1. Markedly enlarged myomatous uterus, substantially increased since
07/09/2014 PET-CT study. GYN consultation suggested.
2. No acute abnormality. No evidence of bowel obstruction or acute
bowel inflammation. Mild colonic diverticulosis, with no evidence of
acute diverticulitis.

## 2021-08-05 ENCOUNTER — Ambulatory Visit: Payer: Medicare Other | Admitting: Internal Medicine

## 2021-08-17 ENCOUNTER — Encounter: Payer: Self-pay | Admitting: Internal Medicine

## 2021-08-17 NOTE — Progress Notes (Signed)
Subjective:    Patient ID: Stephanie Werner, female    DOB: 06/12/1980, 41 y.o.   MRN: [id number]      HPI Stephanie Werner is here for  Chief Complaint  Patient presents with   Discoloration of urine    Would like to screen for STI's also     Discoloration in urine - her urine is sometimes clear, yellow or orange.  She denies blood in the urine.  She noticed this about one month.  She denies dysuria, frequency.  She does have some chronic pelvic pain from a very large fibroid, but nothing new.  She has very heavy menses secondary to that.  She denies any vaginal discharge or vaginal pain.  She denies fever.    Medications and allergies reviewed with patient and updated if appropriate.  Current Outpatient Medications on File Prior to Visit  Medication Sig Dispense Refill   albuterol (VENTOLIN HFA) 108 (90 Base) MCG/ACT inhaler INHALE 2 PUFFS BY MOUTH EVERY 6 HOURS AS NEEDED FOR WHEEZING AND FOR SHORTNESS OF BREATH 18 g 2   amphetamine-dextroamphetamine (ADDERALL) 20 MG tablet Take 20 mg by mouth 2 (two) times daily.  0   Azelaic Acid 15 % cream azelaic acid 15 % topical gel  APPLY TO THE FACE IN THE EVENING     buPROPion (WELLBUTRIN XL) 300 MG 24 hr tablet Take 300 mg by mouth every morning.     cetirizine (ZYRTEC) 10 MG tablet Take 1 tablet (10 mg total) by mouth daily. Need office visit for more refills 30 tablet 0   Cholecalciferol (VITAMIN D-3) 5000 units TABS Taking daily 30 tablet    Fe Cbn-Fe Gluc-FA-B12-C-DSS (FERRALET 90) 90-1 MG TABS Take 1 tablet by mouth daily.  3   Fe Cbn-Fe Gluc-FA-B12-C-DSS (FERRALET 90) 90-1 MG TABS Ferralet 90  ; Folic Acid 1MG ; Vitamin B12  (cyanocobalamin) ; Vitamin C 120MG ; Docusate Sodium 50MG   1 po qd     fluticasone (FLONASE) 50 MCG/ACT nasal spray Place 2 sprays into both nostrils daily. 16 g 5   HYDROcodone-acetaminophen (NORCO) 10-325 MG tablet      mupirocin cream (BACTROBAN) 2 % Apply 1 application topically 2 (two) times daily. 15 g  0   mupirocin ointment (BACTROBAN) 2 % APPLY  OINTMENT TO EACH NOSTRIL TWICE DAILY 22 g 0   Na Sulfate-K Sulfate-Mg Sulf (SUPREP BOWEL PREP KIT) 17.5-3.13-1.6 GM/177ML SOLN Take 1 kit by mouth as directed. For colonoscopy prep 354 mL 0   norethindrone (MICRONOR) 0.35 MG tablet Take 1 tablet by mouth daily.     Omega-3 Fatty Acids (FISH OIL) 1200 MG CAPS Taking daily     omeprazole (PRILOSEC) 20 MG capsule Take 1 capsule (20 mg total) by mouth as needed. 90 capsule 3   scopolamine (TRANSDERM-SCOP, 1.5 MG,) 1 MG/3DAYS Place 1 patch (1.5 mg total) onto the skin every 3 (three) days. 10 patch 0   sodium chloride (OCEAN) 0.65 % SOLN nasal spray Place 1 spray into both nostrils as needed for congestion. 15 mL 0   tretinoin (RETIN-A) 0.025 % cream tretinoin 0.025 % topical cream  APPLY A PEA SIZE TO AFFECTED AREAS ON THE FACE FOR ACNE IN THE EVENING AND EVERY OTHER NIGHT     No current facility-administered medications on file prior to visit.    Review of Systems  Constitutional:  Negative for fever.  Gastrointestinal:  Negative for nausea.  Genitourinary:  Positive for difficulty urinating (? related to fibroids), menstrual problem (heavy due  to fibroids) and pelvic pain (chronic due to fibroids). Negative for dysuria, frequency, hematuria, urgency, vaginal discharge and vaginal pain.       Urine odor at times       Objective:   Vitals:   08/18/21 1432  BP: 122/76  Pulse: (!) 128  Temp: 98.7 F (37.1 C)  SpO2: 99%   BP Readings from Last 3 Encounters:  08/18/21 122/76  04/28/20 114/72  04/11/20 120/76   Wt Readings from Last 3 Encounters:  08/18/21 150 lb (68 kg)  04/28/20 180 lb (81.6 kg)  04/11/20 179 lb (81.2 kg)   Body mass index is 29.29 kg/m.    Physical Exam Constitutional:      General: She is not in acute distress.    Appearance: Normal appearance. She is not ill-appearing.  HENT:     Head: Normocephalic.  Eyes:     Conjunctiva/sclera: Conjunctivae normal.   Abdominal:     General: There is no distension.     Palpations: Abdomen is soft. There is mass (large fibroid uterus- nontender).     Tenderness: There is no abdominal tenderness. There is no right CVA tenderness, left CVA tenderness, guarding or rebound.  Skin:    General: Skin is warm and dry.  Neurological:     Mental Status: She is alert.            Assessment & Plan:    See Problem List for Assessment and Plan of chronic medical problems.

## 2021-08-18 ENCOUNTER — Ambulatory Visit (INDEPENDENT_AMBULATORY_CARE_PROVIDER_SITE_OTHER): Payer: Medicare Other | Admitting: Internal Medicine

## 2021-08-18 VITALS — BP 122/76 | HR 128 | Temp 98.7°F | Ht 60.0 in | Wt 150.0 lb

## 2021-08-18 DIAGNOSIS — Z113 Encounter for screening for infections with a predominantly sexual mode of transmission: Secondary | ICD-10-CM | POA: Insufficient documentation

## 2021-08-18 DIAGNOSIS — L608 Other nail disorders: Secondary | ICD-10-CM | POA: Diagnosis not present

## 2021-08-18 DIAGNOSIS — R3989 Other symptoms and signs involving the genitourinary system: Secondary | ICD-10-CM | POA: Diagnosis not present

## 2021-08-18 DIAGNOSIS — J45909 Unspecified asthma, uncomplicated: Secondary | ICD-10-CM | POA: Insufficient documentation

## 2021-08-18 DIAGNOSIS — J4520 Mild intermittent asthma, uncomplicated: Secondary | ICD-10-CM

## 2021-08-18 DIAGNOSIS — L709 Acne, unspecified: Secondary | ICD-10-CM

## 2021-08-18 LAB — POCT URINALYSIS DIPSTICK
Bilirubin, UA: NEGATIVE
Blood, UA: NEGATIVE
Glucose, UA: NEGATIVE
Ketones, UA: POSITIVE
Nitrite, UA: NEGATIVE
Protein, UA: POSITIVE — AB
Spec Grav, UA: >=1.03 — AB (ref 1.010–1.025)
Urobilinogen, UA: NEGATIVE E.U./dL — AB
pH, UA: 5 (ref 5.0–8.0)

## 2021-08-18 MED ORDER — ALBUTEROL SULFATE HFA 108 (90 BASE) MCG/ACT IN AERS: INHALATION_SPRAY | RESPIRATORY_TRACT | 5 refills | Status: AC

## 2021-08-18 MED ORDER — FERROUS SULFATE 325 (65 FE) MG PO TBEC: 325.0000 mg | DELAYED_RELEASE_TABLET | Freq: Every day | ORAL | 3 refills | Status: AC

## 2021-08-18 MED ORDER — TRETINOIN 0.025 % EX CREA
TOPICAL_CREAM | CUTANEOUS | 0 refills | Status: DC
Start: 2021-08-18 — End: 2021-09-22

## 2021-08-18 NOTE — Assessment & Plan Note (Signed)
Chronic Would like refill of tretinoin-refilled today Advised to use nightly and discussed that it can cause dryness

## 2021-08-19 ENCOUNTER — Telehealth: Payer: Self-pay | Admitting: Internal Medicine

## 2021-08-19 NOTE — Telephone Encounter (Signed)
Spoke with patient to discuss further labs needed.

## 2021-08-19 NOTE — Telephone Encounter (Signed)
Pt requesting call due to concerns/stated missed call request call bk.  Stated to pt.readings     Please advise

## 2021-08-20 LAB — CULTURE, URINE COMPREHENSIVE: RESULT:: NO GROWTH

## 2021-09-22 ENCOUNTER — Telehealth: Payer: Self-pay | Admitting: Internal Medicine

## 2021-09-22 MED ORDER — TRETINOIN 0.05 % EX CREA: TOPICAL_CREAM | Freq: Every day | CUTANEOUS | 2 refills | Status: AC

## 2021-09-22 NOTE — Telephone Encounter (Signed)
Pt is requesting an increase in dosage on tretinoin (RETIN-A) 0.025 % cream. She would like rx sent to  [facility], [place]. Phone:  [phone]  Fax:  [phone]      Please advise

## 2021-11-24 ENCOUNTER — Telehealth: Payer: Self-pay | Admitting: Internal Medicine

## 2021-11-24 NOTE — Telephone Encounter (Signed)
N/A unable to leave a  message for patient to call back to schedule Medicare Annual Wellness Visit   No hx of AWV eligible as of 01/23/20  Please schedule at anytime with [facility] Advisor if patient calls the office.   Any questions, please call me at [phone]

## 2022-04-19 ENCOUNTER — Telehealth: Payer: Self-pay

## 2022-04-19 NOTE — Telephone Encounter (Signed)
Called patient to schedule Medicare Annual Wellness Visit (AWV). Left message for patient to call back and schedule Medicare Annual Wellness Visit (AWV).  Last date of AWV: No HX of AWV, eligible 01/23/20  Please schedule an appointment at any time with [facility].   Norton Blizzard, Joaquin (AAMA)  [place] [facility] [phone]

## 2022-05-13 ENCOUNTER — Encounter: Payer: Self-pay | Admitting: Internal Medicine

## 2022-05-13 ENCOUNTER — Ambulatory Visit (INDEPENDENT_AMBULATORY_CARE_PROVIDER_SITE_OTHER): Payer: Medicare Other | Admitting: Internal Medicine

## 2022-05-13 VITALS — BP 122/76 | HR 77 | Temp 98.8°F | Ht 60.0 in | Wt 162.0 lb

## 2022-05-13 DIAGNOSIS — R3989 Other symptoms and signs involving the genitourinary system: Secondary | ICD-10-CM

## 2022-05-13 DIAGNOSIS — L089 Local infection of the skin and subcutaneous tissue, unspecified: Secondary | ICD-10-CM

## 2022-05-13 DIAGNOSIS — R739 Hyperglycemia, unspecified: Secondary | ICD-10-CM | POA: Diagnosis not present

## 2022-05-13 DIAGNOSIS — R5383 Other fatigue: Secondary | ICD-10-CM

## 2022-05-13 DIAGNOSIS — J0100 Acute maxillary sinusitis, unspecified: Secondary | ICD-10-CM | POA: Diagnosis not present

## 2022-05-13 DIAGNOSIS — L729 Follicular cyst of the skin and subcutaneous tissue, unspecified: Secondary | ICD-10-CM

## 2022-05-13 DIAGNOSIS — J329 Chronic sinusitis, unspecified: Secondary | ICD-10-CM | POA: Insufficient documentation

## 2022-05-13 DIAGNOSIS — R87612 Low grade squamous intraepithelial lesion on cytologic smear of cervix (LGSIL): Secondary | ICD-10-CM | POA: Insufficient documentation

## 2022-05-13 LAB — CBC WITH DIFFERENTIAL/PLATELET
Basophils Absolute: 0 10*3/uL (ref 0.0–0.1)
Basophils Relative: 0.5 % (ref 0.0–3.0)
Eosinophils Absolute: 0.1 10*3/uL (ref 0.0–0.7)
Eosinophils Relative: 1.7 % (ref 0.0–5.0)
HCT: 37 % (ref 36.0–46.0)
Hemoglobin: 12.5 g/dL (ref 12.0–15.0)
Lymphocytes Relative: 56.8 % — ABNORMAL HIGH (ref 12.0–46.0)
Lymphs Abs: 2.7 10*3/uL (ref 0.7–4.0)
MCHC: 33.8 g/dL (ref 30.0–36.0)
MCV: 94.1 fl (ref 78.0–100.0)
Monocytes Absolute: 0.4 10*3/uL (ref 0.1–1.0)
Monocytes Relative: 9.3 % (ref 3.0–12.0)
Neutro Abs: 1.5 10*3/uL (ref 1.4–7.7)
Neutrophils Relative %: 31.7 % — ABNORMAL LOW (ref 43.0–77.0)
Platelets: 354 10*3/uL (ref 150.0–400.0)
RBC: 3.93 Mil/uL (ref 3.87–5.11)
RDW: 12.9 % (ref 11.5–15.5)
WBC: 4.7 10*3/uL (ref 4.0–10.5)

## 2022-05-13 LAB — COMPREHENSIVE METABOLIC PANEL
ALT: 14 U/L (ref 0–35)
AST: 18 U/L (ref 0–37)
Albumin: 4.3 g/dL (ref 3.5–5.2)
Alkaline Phosphatase: 68 U/L (ref 39–117)
BUN: 6 mg/dL (ref 6–23)
CO2: 27 mEq/L (ref 19–32)
Calcium: 9.7 mg/dL (ref 8.4–10.5)
Chloride: 106 mEq/L (ref 96–112)
Creatinine, Ser: 0.73 mg/dL (ref 0.40–1.20)
GFR: 101.04 mL/min (ref >60.00)
Glucose, Bld: 81 mg/dL (ref 70–99)
Potassium: 4.1 mEq/L (ref 3.5–5.1)
Sodium: 139 mEq/L (ref 135–145)
Total Bilirubin: 0.2 mg/dL (ref 0.2–1.2)
Total Protein: 7.9 g/dL (ref 6.0–8.3)

## 2022-05-13 LAB — HEMOGLOBIN A1C: Hgb A1c MFr Bld: 5.2 % (ref 4.6–6.5)

## 2022-05-13 LAB — TSH: TSH: 2.96 u[IU]/mL (ref 0.35–5.50)

## 2022-05-13 MED ORDER — DOXYCYCLINE HYCLATE 100 MG PO TABS: 100.0000 mg | ORAL_TABLET | Freq: Two times a day (BID) | ORAL | 0 refills | Status: AC

## 2022-05-13 NOTE — Assessment & Plan Note (Signed)
Acute on chronic Has chronic fatigue syndrome, but complains of increased fatigue Check CBC, CMP and TSH Advised that a systemic yeast infection is unlikely to be the cause cause of her fatigue

## 2022-05-13 NOTE — Patient Instructions (Addendum)
     Have blood work done today.     Medications changes include :   doxycycline twice daily for 1 week  .    Return if symptoms worsen or fail to improve.

## 2022-05-13 NOTE — Assessment & Plan Note (Signed)
Subacute Persistent symptoms of sinus infection-concern for bacterial cause given duration Can take over-the-counter cold medications as needed Start doxycycline 100 mg twice daily x 7 days

## 2022-05-13 NOTE — Assessment & Plan Note (Signed)
Left buttock region She was able to get some pus out of it - improved, but still present  Not painful now, but concern that it is still there Start doxycycline 100 mg bid x 7 day

## 2022-05-13 NOTE — Progress Notes (Signed)
Subjective:    Patient ID: Stephanie Werner, female    DOB: [birth year], 42 y.o.   MRN: [id number]      HPI Stephanie Werner is here for  Chief Complaint  Patient presents with   infection    Thinks she may have a fungal infection and also has a boil on left buttocks , also is having fatigue and not feeling like herself     Had a sinus infection for a couple of week - still has facial pain, congestion with yellow mucus.   Left buttock boil - not painful. Tried warm compresses, she did lance it with a pin and got some stuff out.  She is not able to get anything else out.  It does not hurt.  Two toenails fell off on her left foot - they were yellow.  ? Fungal infection.  She still has a couple of toenails that are discolored-did hear from dermatology about making an appointment.   Feels fatigued-more than usual- ? yeast infection causing it.  She does have chronic fatigue syndrome.  Medications and allergies reviewed with patient and updated if appropriate.  Current Outpatient Medications on File Prior to Visit  Medication Sig Dispense Refill   albuterol (VENTOLIN HFA) 108 (90 Base) MCG/ACT inhaler INHALE 2 PUFFS BY MOUTH EVERY 6 HOURS AS NEEDED FOR WHEEZING AND FOR SHORTNESS OF BREATH 18 g 5   amphetamine-dextroamphetamine (ADDERALL) 20 MG tablet Take 20 mg by mouth 2 (two) times daily.  0   Azelaic Acid 15 % cream azelaic acid 15 % topical gel  APPLY TO THE FACE IN THE EVENING     buPROPion (WELLBUTRIN XL) 300 MG 24 hr tablet Take 300 mg by mouth every morning.     cetirizine (ZYRTEC) 10 MG tablet Take 1 tablet (10 mg total) by mouth daily. Need office visit for more refills 30 tablet 0   Cholecalciferol (VITAMIN D-3) 5000 units TABS Taking daily 30 tablet    ferrous sulfate (FE TABS) 325 (65 FE) MG EC tablet Take 1 tablet (325 mg total) by mouth daily with breakfast. 90 tablet 3   fluticasone (FLONASE) 50 MCG/ACT nasal spray Place 2 sprays into both nostrils daily. 16 g 5    HYDROcodone-acetaminophen (NORCO) 10-325 MG tablet      mupirocin cream (BACTROBAN) 2 % Apply 1 application topically 2 (two) times daily. 15 g 0   mupirocin ointment (BACTROBAN) 2 % APPLY  OINTMENT TO EACH NOSTRIL TWICE DAILY 22 g 0   Na Sulfate-K Sulfate-Mg Sulf (SUPREP BOWEL PREP KIT) 17.5-3.13-1.6 GM/177ML SOLN Take 1 kit by mouth as directed. For colonoscopy prep 354 mL 0   norethindrone (MICRONOR) 0.35 MG tablet Take 1 tablet by mouth daily.     Omega-3 Fatty Acids (FISH OIL) 1200 MG CAPS Taking daily     omeprazole (PRILOSEC) 20 MG capsule Take 1 capsule (20 mg total) by mouth as needed. 90 capsule 3   scopolamine (TRANSDERM-SCOP, 1.5 MG,) 1 MG/3DAYS Place 1 patch (1.5 mg total) onto the skin every 3 (three) days. 10 patch 0   sodium chloride (OCEAN) 0.65 % SOLN nasal spray Place 1 spray into both nostrils as needed for congestion. 15 mL 0   tretinoin (RETIN-A) 0.05 % cream Apply topically at bedtime. 20 g 2   No current facility-administered medications on file prior to visit.    Review of Systems  Constitutional:  Negative for fever.  HENT:  Positive for congestion and sinus pressure. Negative for ear pain  and sore throat.   Respiratory:  Positive for cough (a couple of days ago). Negative for shortness of breath and wheezing.   Cardiovascular:  Positive for chest pain (msk - last week). Negative for palpitations and leg swelling.  Gastrointestinal:  Negative for abdominal pain, constipation and diarrhea.  Genitourinary:  Negative for dysuria.  Musculoskeletal:  Positive for arthralgias (same).  Skin:        Nail discoloration  Neurological:  Positive for headaches (occ). Negative for light-headedness.       Objective:   Vitals:   05/13/22 1118  BP: 122/76  Pulse: 77  Temp: 98.8 F (37.1 C)  SpO2: 100%   BP Readings from Last 3 Encounters:  05/13/22 122/76  08/18/21 122/76  04/28/20 114/72   Wt Readings from Last 3 Encounters:  05/13/22 162 lb (73.5 kg)  08/18/21  150 lb (68 kg)  04/28/20 180 lb (81.6 kg)   Body mass index is 31.64 kg/m.    Physical Exam Constitutional:      General: She is not in acute distress.    Appearance: Normal appearance. She is not ill-appearing.  HENT:     Head: Normocephalic and atraumatic.     Right Ear: Tympanic membrane, ear canal and external ear normal.     Left Ear: Tympanic membrane, ear canal and external ear normal.     Mouth/Throat:     Mouth: Mucous membranes are moist.     Pharynx: No oropharyngeal exudate or posterior oropharyngeal erythema.  Eyes:     Conjunctiva/sclera: Conjunctivae normal.  Cardiovascular:     Rate and Rhythm: Normal rate and regular rhythm.  Pulmonary:     Effort: Pulmonary effort is normal. No respiratory distress.     Breath sounds: Normal breath sounds. No wheezing or rales.  Musculoskeletal:     Cervical back: Neck supple. No tenderness.  Lymphadenopathy:     Cervical: No cervical adenopathy.  Skin:    General: Skin is warm and dry.     Findings: Lesion (pimple like lesion left lower inner buttock - non-tender) present. No erythema or rash.     Comments: A couple of nails have dark discoloration-left fifth toenail-Home toenail is darker, bilateral first toenails streaks of darkness on medial and lateral aspects  Neurological:     Mental Status: She is alert.            Assessment & Plan:    See Problem List for Assessment and Plan of chronic medical problems.

## 2022-05-13 NOTE — Assessment & Plan Note (Signed)
Has had elevated sugars in the past States she is eating a lot of sugars Check a1c

## 2022-05-17 LAB — GC/CHLAMYDIA PROBE AMP
Chlamydia trachomatis, NAA: NEGATIVE
Neisseria Gonorrhoeae by PCR: NEGATIVE

## 2022-06-28 ENCOUNTER — Telehealth: Payer: Self-pay

## 2022-06-28 NOTE — Telephone Encounter (Signed)
Left voice mail for pt to call to schedule Medicare Wellness visit

## 2023-03-29 ENCOUNTER — Encounter: Payer: Self-pay | Admitting: Internal Medicine

## 2023-03-29 NOTE — Progress Notes (Signed)
 Subjective:    Patient ID: Stephanie Werner, female    DOB: May 23, 1980, 43 y.o.   MRN: [id number]      HPI Stephanie Werner is here for  Chief Complaint  Patient presents with   Medical Management of Chronic Issues    Period cycle has been off; Patient notes brown discharge (like diarrhea) Did PH test at home and it was high; Patient has hx of fibroids and heaving bleeding     She did an at home test of her vaginal Ph and it was high-she was concerned about this.  Clear fluid discharge from vagina - when she sits up too fast, occur randomly  Menses has become about 10 days - at the end of her menses it is brown.  Heavy flow due to fibroids.   - overall longer and heavier.  Always has some discomfort due to fibroids.  Menses are still regular.    Has not seen a gyn in a while.      No pain, fever, headaches  She is taking 2 iron pills daily.  This does cause some constipation which she is able to counteract for the most part.  Medications and allergies reviewed with patient and updated if appropriate.  Current Outpatient Medications on File Prior to Visit  Medication Sig Dispense Refill   albuterol  (VENTOLIN  HFA) 108 (90 Base) MCG/ACT inhaler INHALE 2 PUFFS BY MOUTH EVERY 6 HOURS AS NEEDED FOR WHEEZING AND FOR SHORTNESS OF BREATH 18 g 5   amphetamine -dextroamphetamine  (ADDERALL) 20 MG tablet Take 20 mg by mouth 2 (two) times daily.  0   Azelaic Acid 15 % cream azelaic acid 15 % topical gel  APPLY TO THE FACE IN THE EVENING     buPROPion  (WELLBUTRIN  XL) 300 MG 24 hr tablet Take 300 mg by mouth every morning.     cetirizine  (ZYRTEC ) 10 MG tablet Take 1 tablet (10 mg total) by mouth daily. Need office visit for more refills 30 tablet 0   Cholecalciferol (VITAMIN D -3) 5000 units TABS Taking daily 30 tablet    ferrous sulfate  (FE TABS) 325 (65 FE) MG EC tablet Take 1 tablet (325 mg total) by mouth daily with breakfast. 90 tablet 3   fluticasone  (FLONASE ) 50 MCG/ACT nasal spray Place 2  sprays into both nostrils daily. 16 g 5   Na Sulfate-K Sulfate-Mg Sulf (SUPREP BOWEL PREP  KIT) 17.5-3.13-1.6 GM/177ML SOLN Take 1 kit by mouth as directed. For colonoscopy prep 354 mL 0   Omega-3 Fatty Acids (FISH OIL ) 1200 MG CAPS Taking daily     sodium chloride  (OCEAN) 0.65 % SOLN nasal spray Place 1 spray into both nostrils as needed for congestion. 15 mL 0   tretinoin  (RETIN-A ) 0.05 % cream Apply topically at bedtime. 20 g 2   No current facility-administered medications on file prior to visit.    Review of Systems  Constitutional:  Positive for fatigue. Negative for fever.  Respiratory:  Negative for shortness of breath.   Cardiovascular:  Negative for chest pain.  Genitourinary:  Positive for menstrual problem. Negative for dysuria, frequency and urgency.  Neurological:  Negative for light-headedness.       Objective:   Vitals:   03/30/23 1010  BP: 114/80  Pulse: 70  Temp: 98.2 F (36.8 C)  SpO2: 98%   BP Readings from Last 3 Encounters:  03/30/23 114/80  05/13/22 122/76  08/18/21 122/76   Wt Readings from Last 3 Encounters:  03/30/23 177 lb (80.3 kg)  05/13/22  162 lb (73.5 kg)  08/18/21 150 lb (68 kg)   Body mass index is 34.57 kg/m.    Physical Exam Constitutional:      General: She is not in acute distress.    Appearance: Normal appearance. She is not ill-appearing.  Abdominal:     General: There is no distension.     Palpations: Abdomen is soft. There is mass (large palpable fibroid).     Tenderness: There is no abdominal tenderness. There is no guarding or rebound.  Skin:    General: Skin is warm and dry.  Neurological:     Mental Status: She is alert.            Assessment & Plan:    See Problem List for Assessment and Plan of chronic medical problems.

## 2023-03-29 NOTE — Patient Instructions (Addendum)
      Blood work was ordered.       Medications changes include :   None     [facility] Phone : [phone]  [facility] Address: [address] [place] [address], [place], [place] [address] Phone: [phone]

## 2023-03-30 ENCOUNTER — Ambulatory Visit: Payer: Medicare Other | Admitting: Internal Medicine

## 2023-03-30 VITALS — BP 114/80 | HR 70 | Temp 98.2°F | Ht 60.0 in | Wt 177.0 lb

## 2023-03-30 DIAGNOSIS — N920 Excessive and frequent menstruation with regular cycle: Secondary | ICD-10-CM

## 2023-03-30 DIAGNOSIS — D509 Iron deficiency anemia, unspecified: Secondary | ICD-10-CM | POA: Diagnosis not present

## 2023-03-30 DIAGNOSIS — N898 Other specified noninflammatory disorders of vagina: Secondary | ICD-10-CM | POA: Diagnosis not present

## 2023-03-30 LAB — IBC PANEL
Iron: 103 ug/dL (ref 42–145)
Saturation Ratios: 26 % (ref 20.0–50.0)
TIBC: 396.2 ug/dL (ref 250.0–450.0)
Transferrin: 283 mg/dL (ref 212.0–360.0)

## 2023-03-30 LAB — CBC WITH DIFFERENTIAL/PLATELET
Basophils Absolute: 0 10*3/uL (ref 0.0–0.1)
Basophils Relative: 0.4 % (ref 0.0–3.0)
Eosinophils Absolute: 0.1 10*3/uL (ref 0.0–0.7)
Eosinophils Relative: 2.2 % (ref 0.0–5.0)
HCT: 39 % (ref 36.0–46.0)
Hemoglobin: 12.9 g/dL (ref 12.0–15.0)
Lymphocytes Relative: 49 % — ABNORMAL HIGH (ref 12.0–46.0)
Lymphs Abs: 2.4 10*3/uL (ref 0.7–4.0)
MCHC: 33 g/dL (ref 30.0–36.0)
MCV: 95.3 fl (ref 78.0–100.0)
Monocytes Absolute: 0.6 10*3/uL (ref 0.1–1.0)
Monocytes Relative: 13 % — ABNORMAL HIGH (ref 3.0–12.0)
Neutro Abs: 1.8 10*3/uL (ref 1.4–7.7)
Neutrophils Relative %: 35.4 % — ABNORMAL LOW (ref 43.0–77.0)
Platelets: 384 10*3/uL (ref 150.0–400.0)
RBC: 4.09 Mil/uL (ref 3.87–5.11)
RDW: 14.1 % (ref 11.5–15.5)
WBC: 4.9 10*3/uL (ref 4.0–10.5)

## 2023-03-30 LAB — COMPREHENSIVE METABOLIC PANEL
ALT: 16 U/L (ref 0–35)
AST: 20 U/L (ref 0–37)
Albumin: 4.4 g/dL (ref 3.5–5.2)
Alkaline Phosphatase: 54 U/L (ref 39–117)
BUN: 6 mg/dL (ref 6–23)
CO2: 25 meq/L (ref 19–32)
Calcium: 9.4 mg/dL (ref 8.4–10.5)
Chloride: 103 meq/L (ref 96–112)
Creatinine, Ser: 0.81 mg/dL (ref 0.40–1.20)
GFR: 88.64 mL/min (ref >60.00)
Glucose, Bld: 78 mg/dL (ref 70–99)
Potassium: 3.7 meq/L (ref 3.5–5.1)
Sodium: 137 meq/L (ref 135–145)
Total Bilirubin: 0.3 mg/dL (ref 0.2–1.2)
Total Protein: 8.3 g/dL (ref 6.0–8.3)

## 2023-03-30 LAB — TSH: TSH: 5.27 u[IU]/mL (ref 0.35–5.50)

## 2023-03-30 LAB — FERRITIN: Ferritin: 13.2 ng/mL (ref 10.0–291.0)

## 2023-03-30 NOTE — Assessment & Plan Note (Signed)
 Chronic Has fibroids and heavy menses Taking 2 iron pills daily Menses have been more prolonged and are still very heavy Continue iron 2 tabs daily Discussed that she needs to follow-up with GYN especially since there has been a change in her menstrual cycle and her fibroids seem to be getting larger-given practices that she can call to get established Will check CBC, CMP, iron levels

## 2023-03-30 NOTE — Assessment & Plan Note (Signed)
 She states of vaginal discharge-she states it is clear and there is no odor Does not sound like BV or STI Advised GYN evaluation especially given large fibroid which I am not sure if it is related or not

## 2023-03-31 ENCOUNTER — Encounter: Payer: Self-pay | Admitting: Internal Medicine

## 2023-08-10 ENCOUNTER — Encounter: Payer: Self-pay | Admitting: Family

## 2023-08-17 ENCOUNTER — Encounter: Admitting: Obstetrics and Gynecology

## 2023-10-13 ENCOUNTER — Ambulatory Visit: Admitting: Obstetrics and Gynecology

## 2023-10-13 ENCOUNTER — Encounter: Payer: Self-pay | Admitting: Obstetrics and Gynecology

## 2023-10-13 ENCOUNTER — Other Ambulatory Visit (HOSPITAL_COMMUNITY)
Admission: RE | Admit: 2023-10-13 | Discharge: 2023-10-13 | Disposition: A | Discharge status: Home / Self Care | Source: Ambulatory Visit | Attending: Obstetrics and Gynecology | Admitting: Obstetrics and Gynecology

## 2023-10-13 ENCOUNTER — Encounter: Payer: Self-pay | Admitting: Family

## 2023-10-13 VITALS — BP 110/52 | HR 88 | Ht 60.24 in | Wt 175.8 lb

## 2023-10-13 DIAGNOSIS — Z01419 Encounter for gynecological examination (general) (routine) without abnormal findings: Secondary | ICD-10-CM | POA: Insufficient documentation

## 2023-10-13 DIAGNOSIS — N921 Excessive and frequent menstruation with irregular cycle: Secondary | ICD-10-CM

## 2023-10-13 DIAGNOSIS — D219 Benign neoplasm of connective and other soft tissue, unspecified: Secondary | ICD-10-CM | POA: Diagnosis not present

## 2023-10-13 DIAGNOSIS — R87612 Low grade squamous intraepithelial lesion on cytologic smear of cervix (LGSIL): Secondary | ICD-10-CM | POA: Diagnosis not present

## 2023-10-13 DIAGNOSIS — Z1211 Encounter for screening for malignant neoplasm of colon: Secondary | ICD-10-CM

## 2023-10-13 DIAGNOSIS — Z9189 Other specified personal risk factors, not elsewhere classified: Secondary | ICD-10-CM

## 2023-10-13 DIAGNOSIS — D500 Iron deficiency anemia secondary to blood loss (chronic): Secondary | ICD-10-CM

## 2023-10-13 NOTE — Progress Notes (Signed)
 43 y.o. y.o. female here for medicare gyn annual exam. Also with rapidly enlarging fibroids Patient with IBS and frequent constipation and bowel movement. Since 2019 has felt her uterus has grown rapidly. Was afraid of doing a hysterectomy because provider said she would need a midline incision and she would rather have a pfanestiel incision, so did not do the surgery at that time. Thinks the fibroids have grown from a yeast infection and states she has been watching her sugars to help reduce the size. She is bleeding for 7-10 days a month and can saturate a pad in 15 minutes.   Reports history of anemia, heavy periods and iron infusions.  She is on oral iron now twice daily. Has seen her PMD for a blood count.  Patient's last menstrual period was 09/19/2023 (within days). Period Cycle (Days): 30 Period Duration (Days): 7 Period Pattern: Regular Menstrual Flow: Heavy Menstrual Control: Other (Comment) (overnight pads) Menstrual Control Change Freq (Hours): change about every to a hr on the 1st few days Dysmenorrhea: None   CT Abdomen Pelvis W Contrast (Accession [id number]) (Order [id number]) Imaging Date: 01/24/2020 Department: [facility]-CT IMAGING Released By: Ezzard Jolynn HERO Authorizing: Mansouraty, Aloha Raddle., MD   Exam Status  Status  Final [99]   PACS Intelerad Image Link   Show images for CT Abdomen Pelvis W Contrast Study Result  Narrative & Impression  CLINICAL DATA:  Generalized abdominal pain. Chronic constipation. Rectal bleeding with dark stools. History of chronic abdominal lymphadenopathy. History of fibromyalgia.   EXAM: CT ABDOMEN AND PELVIS WITH CONTRAST   TECHNIQUE: Multidetector CT imaging of the abdomen and pelvis was performed using the standard protocol following bolus administration of intravenous contrast.   CONTRAST:  OMNIPAQUE  IOHEXOL  300 MG/ML  SOLN   COMPARISON:  07/09/2014 PET-CT. 08/10/2013 CT  chest, abdomen and pelvis.   FINDINGS: Lower chest: No significant pulmonary nodules or acute consolidative airspace disease.   Hepatobiliary: Normal liver size. No liver mass. Normal gallbladder with no radiopaque cholelithiasis. No biliary ductal dilatation.   Pancreas: Normal, with no mass or duct dilation.   Spleen: Normal size. No mass.   Adrenals/Urinary Tract: Normal adrenals. No renal masses. Mild fullness of the central right renal collecting system without overt hydronephrosis. No left hydronephrosis. Normal bladder.   Stomach/Bowel: Normal non-distended stomach. Normal caliber small bowel with no small bowel wall thickening. Normal appendix. Oral contrast transits to the rectum. Scattered mild colonic diverticulosis with no large bowel wall thickening or significant pericolonic fat stranding. Mild-to-moderate diffuse colorectal stool.   Vascular/Lymphatic: Normal caliber abdominal aorta. Patent portal, splenic, hepatic and renal veins. Mild porta hepatis adenopathy up to 1.1 cm (series 2/image 24), unchanged since 07/09/2014 PET-CT, considered benign. No new pathologically enlarged abdominopelvic nodes.   Reproductive: Markedly enlarged myomatous uterus measuring 25.4 x 12.6 x 19.5 cm, substantially increased since 07/09/2014 PET-CT study. Dominant 14.0 cm right uterine body fibroid. Some of the uterine fibroids demonstrate calcific degeneration. No adnexal masses.   Other: No pneumoperitoneum, ascites or focal fluid collection.     Body mass index is 34.07 kg/m.   Blood pressure (!) 110/52, pulse 88, height 5' 0.24 (1.53 m), weight 175 lb 12.8 oz (79.7 kg), last menstrual period 09/19/2023, SpO2 95%.  No results found for: DIAGPAP, HPVHIGH, ADEQPAP  GYN HISTORY: No results found for: DIAGPAP, HPVHIGH, ADEQPAP  OB History  Gravida Para Term Preterm AB Living  3    3 0  SAB IAB Ectopic Multiple  Live Births          # Outcome Date GA Lbr  Len/2nd Weight Sex Type Anes PTL Lv  3 AB           2 AB           1 AB             Past Medical History:  Diagnosis Date   Anal fissure    Anemia    Anxiety    Arthritis    Depression    Fibromyalgia    GERD (gastroesophageal reflux disease)    IBS (irritable bowel syndrome)    Obesity    Pancreatitis    Uterine leiomyoma     Past Surgical History:  Procedure Laterality Date   IR RADIOLOGIST EVAL & MGMT  02/07/2020   LIPOSUCTION      Current Outpatient Medications on File Prior to Visit  Medication Sig Dispense Refill   albuterol  (VENTOLIN  HFA) 108 (90 Base) MCG/ACT inhaler INHALE 2 PUFFS BY MOUTH EVERY 6 HOURS AS NEEDED FOR WHEEZING AND FOR SHORTNESS OF BREATH 18 g 5   amphetamine -dextroamphetamine  (ADDERALL) 20 MG tablet Take 20 mg by mouth 2 (two) times daily.  0   atomoxetine (STRATTERA) 100 MG capsule Take 100 mg by mouth daily.     buPROPion  (WELLBUTRIN  XL) 300 MG 24 hr tablet Take 300 mg by mouth every morning.     cetirizine  (ZYRTEC ) 10 MG tablet Take 1 tablet (10 mg total) by mouth daily. Need office visit for more refills 30 tablet 0   Cholecalciferol (VITAMIN D -3) 5000 units TABS Taking daily 30 tablet    ferrous sulfate  (FE TABS) 325 (65 FE) MG EC tablet Take 1 tablet (325 mg total) by mouth daily with breakfast. 90 tablet 3   fluticasone  (FLONASE ) 50 MCG/ACT nasal spray Place 2 sprays into both nostrils daily. 16 g 5   Omega-3 Fatty Acids (FISH OIL ) 1200 MG CAPS Taking daily     sodium chloride  (OCEAN) 0.65 % SOLN nasal spray Place 1 spray into both nostrils as needed for congestion. 15 mL 0   Azelaic Acid 15 % cream azelaic acid 15 % topical gel  APPLY TO THE FACE IN THE EVENING (Patient not taking: Reported on 10/13/2023)     ibuprofen (ADVIL) 400 MG tablet Take 400 mg by mouth.     Na Sulfate-K Sulfate-Mg Sulf (SUPREP BOWEL PREP  KIT) 17.5-3.13-1.6 GM/177ML SOLN Take 1 kit by mouth as directed. For colonoscopy prep (Patient not taking: Reported on 10/13/2023)  354 mL 0   tretinoin  (RETIN-A ) 0.05 % cream Apply topically at bedtime. (Patient not taking: Reported on 10/13/2023) 20 g 2   No current facility-administered medications on file prior to visit.    Social History   Socioeconomic History   Marital status: Single    Spouse name: Not on file   Number of children: 0   Years of education: Not on file   Highest education level: Bachelor's degree (e.g., BA, AB, BS)  Occupational History   Occupation: unemployed  Tobacco Use   Smoking status: Never   Smokeless tobacco: Never   Tobacco comments:    never used tobacco  Substance and Sexual Activity   Alcohol use: No    Alcohol/week: 0.0 standard drinks of alcohol   Drug use: No   Sexual activity: Not Currently  Other Topics Concern   Not on file  Social History Narrative   Single, lives alone. Rarely drinks caffeine. No regular exercise.  Social Drivers of Corporate investment banker Strain: Not on file  Food Insecurity: Not on file  Transportation Needs: Not on file  Physical Activity: Not on file  Stress: Not on file  Social Connections: Not on file  Intimate Partner Violence: Not on file    Family History  Problem Relation Age of Onset   Hypertension Mother    Diabetes Mother    Hyperlipidemia Mother    Heart disease Mother    Kidney disease Mother        HD   Colon cancer Mother        Possible history of colon cancer per patient report   Cancer Mother        Possible history of colon cancer per patient report   Cancer Brother        mouth and throat   Diabetes Brother    Esophageal cancer Neg Hx    Inflammatory bowel disease Neg Hx    Liver disease Neg Hx    Pancreatic cancer Neg Hx    Stomach cancer Neg Hx      Allergies  Allergen Reactions   Penicillins Rash and Other (See Comments)    Has patient had a PCN reaction causing immediate rash, facial/tongue/throat swelling, SOB or lightheadedness with hypotension: No  Has patient had a PCN reaction  causing severe rash involving mucus membranes or skin necrosis: }  Has patient had a PCN reaction that required hospitalization: no  Has patient had a PCN reaction occurring within the last 10 years: no  If all of the above answers are NO, then may proceed with Cephalosporin use.      Patient's last menstrual period was Patient's last menstrual period was 09/19/2023 (within days)..            Review of Systems Alls systems reviewed and are negative.     Physical Exam Constitutional:      Appearance: Normal appearance.  Genitourinary:     Vulva and urethral meatus normal.     No lesions in the vagina.     Genitourinary Comments: Uterus up to xyphoid and expands across abdomen >25cm Would not be safe for low midline incision     Right Labia: No rash, lesions or skin changes.    Left Labia: No lesions, skin changes or rash.    No vaginal discharge or tenderness.     No vaginal prolapse present.    No vaginal atrophy present.     Right Adnexa: not tender, not palpable and no mass present.    Left Adnexa: not tender, not palpable and no mass present.    No cervical motion tenderness or discharge.     Uterus is enlarged, tender and irregular.  Breasts:    Right: Normal.     Left: Normal.  HENT:     Head: Normocephalic.  Neck:     Thyroid : No thyroid  mass, thyromegaly or thyroid  tenderness.  Cardiovascular:     Rate and Rhythm: Normal rate and regular rhythm.     Heart sounds: Normal heart sounds, S1 normal and S2 normal.  Pulmonary:     Effort: Pulmonary effort is normal.     Breath sounds: Normal breath sounds and air entry.  Abdominal:     General: There is no distension.     Palpations: Abdomen is soft. There is no mass.     Tenderness: There is no abdominal tenderness. There is no guarding or rebound.  Musculoskeletal:  General: Normal range of motion.     Cervical back: Full passive range of motion without pain, normal range of motion and neck supple. No  tenderness.     Right lower leg: No edema.     Left lower leg: No edema.  Neurological:     Mental Status: She is alert.  Skin:    General: Skin is warm.  Psychiatric:        Mood and Affect: Mood normal.        Behavior: Behavior normal.        Thought Content: Thought content normal.  Vitals and nursing note reviewed. Exam conducted with a chaperone present.       A:         Well Woman GYN exam, rapidly enlarged fibroid uterus, menorrhagia, anemia                             P:        Counseled on concern for fibroids being cancerous with rapid enlargement and need for MRI to evaluate for any atypical features.  Discussed that only 100% way to rule out leiomyosarcoma cancer is with pathology and removal.  Discussed there is no option to shrink the fibroids and this is not treatment for large fibroids in her case and hysterectomy is indicated. I discussed she would need a midline incision as well, considering the size of the uterus.  If the MRI is abnormal, she will need to do surgery with gyn oncology. Again encouraged patient that she she should not delay surgery. Discussed again that sugars or herbal  treatments, yeast infections are the cause of enlarging fibroids and that having a [place] or radiofrequency would be an acceptable treatment at this point and she need a pathological diagnosis to ensure they are benign. Placed referral for urgent MRI of abdomen and pelvis.  40 minutes spent on reviewing records, imaging,  and one on one patient time and counseling patient and documentation Dr. Glennon   No follow-ups on file.  Stephanie Werner

## 2023-10-14 ENCOUNTER — Ambulatory Visit (HOSPITAL_COMMUNITY)
Admission: RE | Admit: 2023-10-14 | Discharge: 2023-10-14 | Disposition: A | Discharge status: Home / Self Care | Source: Ambulatory Visit | Attending: Obstetrics and Gynecology | Admitting: Obstetrics and Gynecology

## 2023-10-14 ENCOUNTER — Ambulatory Visit: Payer: Self-pay | Admitting: Obstetrics and Gynecology

## 2023-10-14 ENCOUNTER — Encounter (HOSPITAL_COMMUNITY): Payer: Self-pay

## 2023-10-14 DIAGNOSIS — D219 Benign neoplasm of connective and other soft tissue, unspecified: Secondary | ICD-10-CM

## 2023-10-14 DIAGNOSIS — D500 Iron deficiency anemia secondary to blood loss (chronic): Secondary | ICD-10-CM

## 2023-10-14 DIAGNOSIS — N921 Excessive and frequent menstruation with irregular cycle: Secondary | ICD-10-CM

## 2023-10-14 LAB — SURESWAB® ADVANCED VAGINITIS PLUS,TMA
C. trachomatis RNA, TMA: NOT DETECTED
CANDIDA SPECIES: NOT DETECTED
Candida glabrata: NOT DETECTED
N. gonorrhoeae RNA, TMA: NOT DETECTED
SURESWAB(R) ADV BACTERIAL VAGINOSIS(BV),TMA: POSITIVE — AB
TRICHOMONAS VAGINALIS (TV),TMA: NOT DETECTED

## 2023-10-14 LAB — CYTOLOGY - PAP
Adequacy: ABSENT
Diagnosis: NEGATIVE

## 2023-10-14 MED ORDER — METRONIDAZOLE 500 MG PO TABS: 500.0000 mg | ORAL_TABLET | Freq: Two times a day (BID) | ORAL | 0 refills | Status: AC

## 2023-10-27 NOTE — Addendum Note (Signed)
 Addended by: GLENNON ALMARIE POUR on: 10/27/2023 11:49 AM   Modules accepted: Level of Service

## 2023-11-23 ENCOUNTER — Encounter: Payer: Self-pay | Admitting: Family

## 2023-11-30 ENCOUNTER — Other Ambulatory Visit (HOSPITAL_COMMUNITY)
Admission: RE | Admit: 2023-11-30 | Discharge: 2023-11-30 | Disposition: A | Discharge status: Home / Self Care | Source: Ambulatory Visit | Attending: Obstetrics and Gynecology | Admitting: Obstetrics and Gynecology

## 2023-11-30 ENCOUNTER — Encounter: Payer: Self-pay | Admitting: Obstetrics and Gynecology

## 2023-11-30 ENCOUNTER — Ambulatory Visit (INDEPENDENT_AMBULATORY_CARE_PROVIDER_SITE_OTHER): Admitting: Obstetrics and Gynecology

## 2023-11-30 ENCOUNTER — Encounter: Payer: Self-pay | Admitting: Family

## 2023-11-30 VITALS — BP 118/60 | HR 97 | Ht 60.43 in | Wt 176.0 lb

## 2023-11-30 DIAGNOSIS — Z01812 Encounter for preprocedural laboratory examination: Secondary | ICD-10-CM | POA: Diagnosis present

## 2023-11-30 DIAGNOSIS — N951 Menopausal and female climacteric states: Secondary | ICD-10-CM

## 2023-11-30 DIAGNOSIS — D219 Benign neoplasm of connective and other soft tissue, unspecified: Secondary | ICD-10-CM | POA: Diagnosis present

## 2023-11-30 DIAGNOSIS — D500 Iron deficiency anemia secondary to blood loss (chronic): Secondary | ICD-10-CM | POA: Insufficient documentation

## 2023-11-30 DIAGNOSIS — R1900 Intra-abdominal and pelvic swelling, mass and lump, unspecified site: Secondary | ICD-10-CM

## 2023-11-30 DIAGNOSIS — N921 Excessive and frequent menstruation with irregular cycle: Secondary | ICD-10-CM | POA: Diagnosis present

## 2023-11-30 LAB — PREGNANCY, URINE: Preg Test, Ur: NEGATIVE

## 2023-11-30 MED ORDER — PROGESTERONE MICRONIZED 100 MG PO CAPS
100.0000 mg | ORAL_CAPSULE | Freq: Every day | ORAL | 2 refills | Status: AC
Start: 2023-11-30 — End: ?

## 2023-11-30 NOTE — Patient Instructions (Signed)
 Perimenopause/Menopause suggestions   You should be getting 1,200 mg of calcium  a day between your diet and supplements and at least 3000 IU a day of Vit D. You should exercise regularly with weight bearing exercises. I would recommend a bone density q 2 years.  We loose 5% per year in this time period of our bone density, due to the loss of estrogen. Magnesium at bedtime for our sleep and bones (follow recommended dose on bottle)  Please read The New Menopause my Dr. Ronal Stagger Haver  Try to avoid caffeine and alcohol in this period, as they can make symptoms worse  Continue annual mammograms, unless told sooner  Please reach out with any questions Stephanie Werner

## 2023-11-30 NOTE — Progress Notes (Signed)
   Acute Office Visit  Subjective:    Patient ID: AAYLAH POKORNY, female    DOB: 1980-05-19, 43 y.o.   MRN: [id number]   HPI 43 y.o. presents today for Procedure (EMB /PAP-10/13/23) .patient reports she had a panic attack with attempt at the MRI Counseled on the importance of the MRI with rapidly enlarging fibroids and to help identify concerning features of the sarcoma.  Discussed pathology is only 100% way to rule out cancer, however. She would like to try again with the MRI. She is here today for the EMB She is having hot flashes and insomnia and would like to run a hormone panel.  She is okay with starting prometrium at night for sleep and to control bleeding until surgery.  Patient's last menstrual period was 11/15/2023 (within days).    Review of Systems     Objective:    OBGyn Exam  BP 118/60   Pulse 97   Ht 5' 0.43 (1.535 m)   Wt 176 lb (79.8 kg)   LMP 11/15/2023 (Within Days)   SpO2 95%   BMI 33.88 kg/m  Wt Readings from Last 3 Encounters:  11/30/23 176 lb (79.8 kg)  10/13/23 175 lb 12.8 oz (79.7 kg)  03/30/23 177 lb (80.3 kg)       Time out performed   PROCEDURE: EMB Consent obtained for the procedure.  A bivalve speculum was placed in the vagina.  The cervix was grasped with a single tooth tenaculum.  Pipelle was inserted and rotated. Uterus sound to >13cm. Adequate specimen was obtained and sent to pathology.  All instruments were removed.  Patient tolerated the procedure well.  To notify patient of the results.  Patient informed chaperone available to be present for breast and/or pelvic exam. Patient has Geni, CMA was present for the procedure  Assessment & Plan:  MRI referral placed.  Counseled on importance EMB collected today To notify patient of the results  Almarie MARLA Carpen

## 2023-12-01 ENCOUNTER — Encounter: Payer: Self-pay | Admitting: Family

## 2023-12-01 ENCOUNTER — Ambulatory Visit: Payer: Self-pay | Admitting: Obstetrics and Gynecology

## 2023-12-01 LAB — SURGICAL PATHOLOGY

## 2023-12-03 ENCOUNTER — Ambulatory Visit (HOSPITAL_COMMUNITY)
Admission: RE | Admit: 2023-12-03 | Discharge: 2023-12-03 | Disposition: A | Discharge status: Home / Self Care | Payer: Self-pay | Source: Ambulatory Visit | Attending: Obstetrics and Gynecology | Admitting: Obstetrics and Gynecology

## 2023-12-03 DIAGNOSIS — N921 Excessive and frequent menstruation with irregular cycle: Secondary | ICD-10-CM | POA: Insufficient documentation

## 2023-12-03 DIAGNOSIS — R1900 Intra-abdominal and pelvic swelling, mass and lump, unspecified site: Secondary | ICD-10-CM | POA: Diagnosis present

## 2023-12-03 DIAGNOSIS — D500 Iron deficiency anemia secondary to blood loss (chronic): Secondary | ICD-10-CM | POA: Diagnosis present

## 2023-12-03 DIAGNOSIS — D219 Benign neoplasm of connective and other soft tissue, unspecified: Secondary | ICD-10-CM | POA: Insufficient documentation

## 2023-12-03 MED ORDER — GADOBUTROL 1 MMOL/ML IV SOLN
7.0000 mL | Freq: Once | INTRAVENOUS | Status: AC | PRN
  Administered 2023-12-03: 7 mL via INTRAVENOUS

## 2023-12-06 ENCOUNTER — Other Ambulatory Visit (HOSPITAL_COMMUNITY): Payer: Self-pay

## 2023-12-06 LAB — FOLLICLE STIMULATING HORMONE: FSH: 11.5 m[IU]/mL

## 2023-12-06 LAB — TESTOS,TOTAL,FREE AND SHBG (FEMALE)
Free Testosterone: 3.1 pg/mL (ref 0.1–6.4)
Sex Hormone Binding: 50 nmol/L (ref 17–124)
Testosterone, Total, LC-MS-MS: 27 ng/dL (ref 2–45)

## 2023-12-06 LAB — ESTRADIOL: Estradiol: 126 pg/mL

## 2023-12-22 NOTE — Telephone Encounter (Signed)
 Spoke with patient. Questions answered regarding repeat EMB. Patient has concerns  about repeat EMB, see previous MyChart message dated 12/15/23.   Advised I will forward to Dr. Glennon to review and follow-up. Patient agreeable.   Dr. Glennon -see previous MyChart message dated 12/15/23 and advise.

## 2024-01-02 ENCOUNTER — Encounter: Payer: Self-pay | Admitting: Family

## 2024-01-09 ENCOUNTER — Ambulatory Visit: Admitting: Obstetrics and Gynecology

## 2024-01-16 ENCOUNTER — Encounter: Payer: Self-pay | Admitting: Internal Medicine

## 2024-01-26 ENCOUNTER — Encounter: Payer: Self-pay | Admitting: Obstetrics and Gynecology

## 2024-02-08 ENCOUNTER — Telehealth: Payer: Self-pay | Admitting: *Deleted

## 2024-02-08 NOTE — Telephone Encounter (Signed)
-----   Message from [place] sent at 02/06/2024 10:24 AM EST ----- Good morning the letter has been sent and reached out to the patient twice. Please advise. Thanks

## 2024-02-08 NOTE — Telephone Encounter (Signed)
 Spoke with patient. Patient was scheduled for repeat EMB on 03/13/24. Patient states she will likely be on her menses at this time, needs something the week of 03/06/24. EMB r/s to 03/06/24 at 1100. Patient states she has a lot of anxiety leading up to scheduling and after procedure. Patient states the procedure itself is not a concern. Patient aware to call if she has any questions or would like to further discuss prior to cancelling. Advised we want to ensure she gets the evaluation needed completed.  Patient appreciative of call.   Routing to provider for final review. Patient is agreeable to disposition. Will close encounter.

## 2024-03-06 ENCOUNTER — Ambulatory Visit: Payer: Self-pay | Admitting: Obstetrics and Gynecology

## 2024-03-13 ENCOUNTER — Ambulatory Visit: Payer: Self-pay | Admitting: Obstetrics and Gynecology

## 2024-03-14 ENCOUNTER — Ambulatory Visit: Admitting: Internal Medicine

## 2024-03-20 ENCOUNTER — Encounter: Payer: Self-pay | Admitting: Obstetrics and Gynecology
